# Patient Record
Sex: Male | Born: 1995
Health system: Southern US, Community
[De-identification: ages and names within clinical notes are randomized; demographics above are authoritative.]

## PROBLEM LIST (undated history)

## (undated) DIAGNOSIS — F909 Attention-deficit hyperactivity disorder, unspecified type: Secondary | ICD-10-CM

## (undated) DIAGNOSIS — J45909 Unspecified asthma, uncomplicated: Secondary | ICD-10-CM

## (undated) DIAGNOSIS — B2790 Infectious mononucleosis, unspecified without complication: Secondary | ICD-10-CM

## (undated) HISTORY — PX: ELBOW SURGERY: SHX618

## (undated) HISTORY — PX: LITHOTRIPSY: SUR834

## (undated) HISTORY — PX: HERNIA REPAIR: SHX51

---
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
## 2013-01-01 ENCOUNTER — Encounter: Payer: Self-pay | Admitting: Sports Medicine

## 2013-01-01 ENCOUNTER — Ambulatory Visit (INDEPENDENT_AMBULATORY_CARE_PROVIDER_SITE_OTHER): Payer: BC Managed Care – PPO | Admitting: Sports Medicine

## 2013-01-01 VITALS — BP 125/64 | HR 65 | Wt 158.0 lb

## 2013-01-01 DIAGNOSIS — F988 Other specified behavioral and emotional disorders with onset usually occurring in childhood and adolescence: Secondary | ICD-10-CM

## 2013-01-01 MED ORDER — AMPHETAMINE-DEXTROAMPHET ER 20 MG PO CP24: 20.0000 mg | ORAL_CAPSULE | Freq: Every day | ORAL | Status: DC

## 2013-01-01 NOTE — Progress Notes (Signed)
  Subjective:    CC: Followup  HPI: ADHD: Seth Walls is a very pleasant 17 year old male, his primary care provider started him on Adderall, he's noted a fantastic response, but is noting some decrease in efficacy. He does desire to go up on the dose. He has no palpitations, chest pains, headache, tremors, sweating, decreased appetite, or change in sleep.  Past medical history, Surgical history, Family history not pertinant except as noted below, Social history, Allergies, and medications have been entered into the medical record, reviewed, and no changes needed.   Review of Systems: No fevers, chills, night sweats, weight loss, chest pain, or shortness of breath.   Objective:    General: Well Developed, well nourished, and in no acute distress.  Neuro: Alert and oriented x3, extra-ocular muscles intact, sensation grossly intact.  HEENT: Normocephalic, atraumatic, pupils equal round reactive to light, neck supple, no masses, no lymphadenopathy, thyroid nonpalpable.  Skin: Warm and dry, no rashes. Cardiac: Regular rate and rhythm, no murmurs rubs or gallops, no lower extremity edema.  Respiratory: Clear to auscultation bilaterally. Not using accessory muscles, speaking in full sentences. Impression and Recommendations:

## 2013-01-01 NOTE — Assessment & Plan Note (Signed)
Doing well with Adderall 10 mg daily. He does note some waning of effect, I am going to increase to 20 mg daily. One month supply given, return to see primary care provider in one month for dose adjustment if needed.

## 2013-03-01 ENCOUNTER — Emergency Department
Admission: EM | Admit: 2013-03-01 | Discharge: 2013-03-01 | Disposition: A | Discharge status: Home / Self Care | Payer: BC Managed Care – PPO | Source: Home / Self Care

## 2013-03-01 ENCOUNTER — Encounter: Payer: Self-pay | Admitting: Emergency Medicine

## 2013-03-01 DIAGNOSIS — B9789 Other viral agents as the cause of diseases classified elsewhere: Secondary | ICD-10-CM

## 2013-03-01 DIAGNOSIS — B349 Viral infection, unspecified: Secondary | ICD-10-CM

## 2013-03-01 HISTORY — DX: Attention-deficit hyperactivity disorder, unspecified type: F90.9

## 2013-03-01 HISTORY — DX: Infectious mononucleosis, unspecified without complication: B27.90

## 2013-03-01 LAB — POCT INFLUENZA A/B
Influenza A, POC: NEGATIVE
Influenza B, POC: NEGATIVE

## 2013-03-01 LAB — POCT RAPID STREP A (OFFICE): Rapid Strep A Screen: NEGATIVE

## 2013-03-01 LAB — POCT MONO SCREEN (KUC): Mono, POC: NEGATIVE

## 2013-03-01 MED ORDER — PREDNISONE 50 MG PO TABS: ORAL_TABLET | ORAL | Status: DC

## 2013-03-01 NOTE — ED Notes (Signed)
Reports onset of congestion, sore throat, cough, headache and body aches with fever x 2 days. No flu vaccination this season. Hx of Mono last year. No OTC today.

## 2013-03-01 NOTE — ED Provider Notes (Signed)
CSN: [id number]     Arrival date & time 03/01/13  1411 History   None    Chief Complaint  Patient presents with  . Sore Throat  . Nasal Congestion  . Generalized Body Aches  . Headache  . Cough    HPI  URI Symptoms Onset: 2 days  Description: rhinorrhea, nasal congestion, fever, sore throat, cough, body aches, generalized malaise Modifying factors:  Prior hx/o mono. Sxs not similar to previous mono flare   Symptoms Nasal discharge: yes Fever: yes Sore throat: yes Cough: yes Wheezing: no Ear pain: no GI symptoms: no Sick contacts: yes; friend with similar sxs 1-2 weeks prior to sxs  Red Flags  Stiff neck: no Dyspnea: no Rash: no Swallowing difficulty: no  Sinusitis Risk Factors Headache/face pain: no Double sickening: no tooth pain: no  Allergy Risk Factors Sneezing: no Itchy scratchy throat: no Seasonal symptoms: no  Flu Risk Factors Headache: no muscle aches: yes severe fatigue: mild    Past Medical History  Diagnosis Date  . Asthma   . ADHD (attention deficit hyperactivity disorder)   . Mononucleosis    Past Surgical History  Procedure Laterality Date  . Hernia repair    . Elbow surgery      right   Family History  Problem Relation Age of Onset  . Hyperlipidemia Mother   . Depression Mother   . Hyperlipidemia Mother    History  Substance Use Topics  . Smoking status: Never Smoker   . Smokeless tobacco: Not on file  . Alcohol Use: No    Review of Systems  All other systems reviewed and are negative.    Allergies  Review of patient's allergies indicates no known allergies.  Home Medications   Current Outpatient Rx  Name  Route  Sig  Dispense  Refill  . amphetamine-dextroamphetamine (ADDERALL XR) 20 MG 24 hr capsule   Oral   Take 1 capsule (20 mg total) by mouth daily.   30 capsule   0    BP 115/66  Pulse 102  Temp(Src) 102.5 F (39.2 C) (Oral)  Resp 16  Ht 5' 8.5" (1.74 m)  Wt 151 lb (68.493 kg)  BMI 22.62 kg/m2   SpO2 99% Physical Exam  Constitutional: He appears well-developed and well-nourished.  HENT:  Head: Normocephalic and atraumatic.  Right Ear: External ear normal.  Left Ear: External ear normal.  +nasal erythema, rhinorrhea bilaterally, + post oropharyngeal erythema    Eyes: Conjunctivae are normal. Pupils are equal, round, and reactive to light.  Neck: Normal range of motion.  Pulmonary/Chest: Effort normal and breath sounds normal.  Abdominal: Soft.  Musculoskeletal: Normal range of motion.  Neurological: He is alert.  Skin: Skin is warm.    ED Course  Procedures (including critical care time) Labs Review Labs Reviewed  ENTEROVIRUS PCR  POCT INFLUENZA A/B  POCT RAPID STREP A (OFFICE)  POCT MONO SCREEN ([facility])   Imaging Review No results found.    MDM   1. Viral illness    Rapid strep, mono, flu negative.  Likely viral illness. Enterovirus PCR pending.  PPx rx for prednisone if pt develops wheezing in setting of asthma.  Discussed infectious and resp red flags.  Follow up as needed.     The patient and/or caregiver has been counseled thoroughly with regard to treatment plan and/or medications prescribed including dosage, schedule, interactions, rationale for use, and possible side effects and they verbalize understanding. Diagnoses and expected course of recovery discussed and will return  if not improved as expected or if the condition worsens. Patient and/or caregiver verbalized understanding.         Doree Albee, MD 03/01/13 [phone]

## 2013-03-04 LAB — ENTEROVIRUS PCR: Enterovirus RNA, RT-PCR: NOT DETECTED

## 2013-03-05 ENCOUNTER — Telehealth: Payer: Self-pay | Admitting: Emergency Medicine

## 2013-05-18 ENCOUNTER — Emergency Department
Admission: EM | Admit: 2013-05-18 | Discharge: 2013-05-18 | Disposition: A | Discharge status: Home / Self Care | Payer: BC Managed Care – PPO | Source: Home / Self Care | Attending: Family Medicine | Admitting: Family Medicine

## 2013-05-18 ENCOUNTER — Encounter: Payer: Self-pay | Admitting: Emergency Medicine

## 2013-05-18 DIAGNOSIS — N451 Epididymitis: Secondary | ICD-10-CM

## 2013-05-18 DIAGNOSIS — N509 Disorder of male genital organs, unspecified: Secondary | ICD-10-CM

## 2013-05-18 DIAGNOSIS — N453 Epididymo-orchitis: Secondary | ICD-10-CM

## 2013-05-18 DIAGNOSIS — N50819 Testicular pain, unspecified: Secondary | ICD-10-CM

## 2013-05-18 LAB — POCT URINALYSIS DIP (MANUAL ENTRY)
Bilirubin, UA: NEGATIVE
Glucose, UA: NEGATIVE
Ketones, POC UA: NEGATIVE
Leukocytes, UA: NEGATIVE
Nitrite, UA: NEGATIVE
Protein Ur, POC: NEGATIVE
Spec Grav, UA: >=1.03 (ref 1.005–1.03)
Urobilinogen, UA: 0.2 (ref 0–1)
pH, UA: 6 (ref 5–8)

## 2013-05-18 LAB — POCT CBC W AUTO DIFF (K'VILLE URGENT CARE)

## 2013-05-18 MED ORDER — DOXYCYCLINE HYCLATE 100 MG PO CAPS: 100.0000 mg | ORAL_CAPSULE | Freq: Two times a day (BID) | ORAL | Status: DC

## 2013-05-18 MED ORDER — CEFTRIAXONE SODIUM 250 MG IJ SOLR
250.0000 mg | Freq: Once | INTRAMUSCULAR | Status: AC
  Administered 2013-05-18: 250 mg via INTRAMUSCULAR

## 2013-05-18 NOTE — ED Notes (Signed)
C/o testicular pain x this AM. Pain in right side started about 1 hour ago. No urinary difficulties.

## 2013-05-18 NOTE — ED Provider Notes (Signed)
CSN: [id number]     Arrival date & time 05/18/13  1620 History   First MD Initiated Contact with Patient 05/18/13 1758     Chief Complaint  Patient presents with  . Testicle Pain      HPI Comments: Patient noted bilateral testicular soreness, worse on the right, this morning.  No urinary symptoms.  No urethral discharge.  No abdominal pain.  No fevers, chills, and sweats.  No nausea/vomiting.   He has past history of bilateral inguinal hernia repair as a child.  Patient is a 17 y.o. male presenting with testicular pain. The history is provided by the patient.  Testicle Pain This is a new problem. The current episode started 6 to 12 hours ago. The problem occurs constantly. The problem has not changed since onset.Pertinent negatives include no abdominal pain. The symptoms are aggravated by walking. Nothing relieves the symptoms. He has tried nothing for the symptoms.    Past Medical History  Diagnosis Date  . Asthma   . ADHD (attention deficit hyperactivity disorder)   . Mononucleosis    Past Surgical History  Procedure Laterality Date  . Hernia repair    . Elbow surgery      right   Family History  Problem Relation Age of Onset  . Hyperlipidemia Mother   . Depression Mother   . Hyperlipidemia Mother    History  Substance Use Topics  . Smoking status: Never Smoker   . Smokeless tobacco: Never Used  . Alcohol Use: No    Review of Systems  Gastrointestinal: Negative for abdominal pain.  Genitourinary: Positive for testicular pain.  All other systems reviewed and are negative.    Allergies  Review of patient's allergies indicates no known allergies.  Home Medications   Current Outpatient Rx  Name  Route  Sig  Dispense  Refill  . doxycycline (VIBRAMYCIN) 100 MG capsule   Oral   Take 1 capsule (100 mg total) by mouth 2 (two) times daily.   14 capsule   0    BP 115/75  Pulse 57  Temp(Src) 98.1 F (36.7 C) (Oral)  Resp 14  Wt 151 lb (68.493 kg)  SpO2  100% Physical Exam Nursing notes and Vital Signs reviewed. Appearance:  Patient appears healthy, stated age, and in no acute distress Eyes:  Pupils are equal, round, and reactive to light and accomodation.  Extraocular movement is intact.  Conjunctivae are not inflamed   Pharynx:  Normal Neck:  Supple.  No adenopathy Lungs:  Clear to auscultation.  Breath sounds are equal.  Heart:  Regular rate and rhythm without murmurs, rubs, or gallops.  Abdomen:  There is vague right lower quadrant tenderness without masses or hepatosplenomegaly.  Bowel sounds are present. Negative iliopsoas and obdurator tests.  No CVA or flank tenderness.  Extremities:  No edema.  No calf tenderness Skin:  No rash present.  Genitourinary:  Penis normal without lesions or urethral discharge.  Scrotum is normal without swelling, erythema, or warmth.  Testes are descended bilaterally without nodules, and are oriented normally.  There is mild right posterior testicular tenderness.  Normal bilateral cremasteric reflex is present. No hernias are palpated.  No regional lymphadenopathy palpated   ED Course  Procedures     Labs Reviewed  URINE CULTURE  GC/CHLAMYDIA PROBE AMP, URINE  POCT URINALYSIS DIP (MANUAL ENTRY) SG >= 1.030; BLO trace-intact, otherwise negative  POCT CBC W AUTO DIFF ([facility])  WBC 6.6; LY 29.9; MO 10.6; GR 59.5; Hgb  14.5; Platelets 212         MDM   1. Testicular pain; no evidence testicular torsion on exam   2. Epididymitis, right    GC/chlamydia, urine culture pending. Empiric Rocephin 250mg  IM, doxycycline 100mg  bid for one week. May take Ibuprofen 200mg , 4 tabs every 8 hours with food. Followup with urologist one week if not improving. If symptoms become significantly worse during the night or over the weekend, proceed to the local emergency room.     Lattie Haw, MD 05/21/13 [phone]

## 2013-05-20 LAB — URINE CULTURE
Colony Count: NO GROWTH
Organism ID, Bacteria: NO GROWTH

## 2013-05-20 LAB — GC/CHLAMYDIA PROBE AMP, URINE
Chlamydia, Swab/Urine, PCR: NEGATIVE
GC Probe Amp, Urine: NEGATIVE

## 2013-05-24 ENCOUNTER — Telehealth: Payer: Self-pay | Admitting: *Deleted

## 2013-06-03 ENCOUNTER — Ambulatory Visit: Payer: BC Managed Care – PPO | Admitting: Family Medicine

## 2013-06-05 ENCOUNTER — Ambulatory Visit: Payer: BC Managed Care – PPO | Admitting: Family Medicine

## 2013-06-05 DIAGNOSIS — Z0289 Encounter for other administrative examinations: Secondary | ICD-10-CM

## 2013-06-18 ENCOUNTER — Encounter: Payer: Self-pay | Admitting: Emergency Medicine

## 2013-06-18 ENCOUNTER — Emergency Department (INDEPENDENT_AMBULATORY_CARE_PROVIDER_SITE_OTHER)
Admission: EM | Admit: 2013-06-18 | Discharge: 2013-06-18 | Disposition: A | Discharge status: Home / Self Care | Payer: BC Managed Care – PPO | Source: Home / Self Care | Attending: Family Medicine | Admitting: Family Medicine

## 2013-06-18 DIAGNOSIS — J069 Acute upper respiratory infection, unspecified: Secondary | ICD-10-CM

## 2013-06-18 DIAGNOSIS — J029 Acute pharyngitis, unspecified: Secondary | ICD-10-CM

## 2013-06-18 DIAGNOSIS — R509 Fever, unspecified: Secondary | ICD-10-CM

## 2013-06-18 LAB — POCT INFLUENZA A/B
Influenza A, POC: NEGATIVE
Influenza B, POC: NEGATIVE

## 2013-06-18 LAB — POCT RAPID STREP A (OFFICE): Rapid Strep A Screen: NEGATIVE

## 2013-06-18 MED ORDER — OSELTAMIVIR PHOSPHATE 75 MG PO CAPS: 75.0000 mg | ORAL_CAPSULE | Freq: Two times a day (BID) | ORAL | Status: DC

## 2013-06-18 MED ORDER — PREDNISONE 50 MG PO TABS: ORAL_TABLET | ORAL | Status: DC

## 2013-06-18 NOTE — ED Provider Notes (Signed)
CSN: [id number]     Arrival date & time 06/18/13  1212 History   First MD Initiated Contact with Patient 06/18/13 1214     Chief Complaint  Patient presents with  . Fever  . Sore Throat    HPI  URI Symptoms Onset: 2 days  Description: rhinorrhea, nasal congestion, cough, fever, sore throat  Modifying factors:  Prior hx/o mono in the past   Symptoms Nasal discharge: yes Fever: yes. tmax 100.8 Sore throat: yes Cough: yes Wheezing: no Ear pain: no GI symptoms: no Sick contacts: yes  Red Flags  Stiff neck: no Dyspnea: no Rash: no Swallowing difficulty: no  Sinusitis Risk Factors Headache/face pain: no Double sickening: no tooth pain: no  Allergy Risk Factors Sneezing: no Itchy scratchy throat: no Seasonal symptoms: no  Flu Risk Factors Headache: no muscle aches: no severe fatigue: no   Past Medical History  Diagnosis Date  . Asthma   . ADHD (attention deficit hyperactivity disorder)   . Mononucleosis    Past Surgical History  Procedure Laterality Date  . Hernia repair    . Elbow surgery      right   Family History  Problem Relation Age of Onset  . Hyperlipidemia Mother   . Depression Mother   . Hyperlipidemia Mother    History  Substance Use Topics  . Smoking status: Never Smoker   . Smokeless tobacco: Never Used  . Alcohol Use: No    Review of Systems  All other systems reviewed and are negative.    Allergies  Review of patient's allergies indicates no known allergies.  Home Medications  No current outpatient prescriptions on file. BP 118/78  Pulse 92  Temp(Src) 100.7 F (38.2 C) (Oral)  Resp 16  Wt 150 lb (68.04 kg)  SpO2 99% Physical Exam  Constitutional: He is oriented to person, place, and time. He appears well-developed and well-nourished.  HENT:  Head: Normocephalic and atraumatic.  Right Ear: External ear normal.  Left Ear: External ear normal.  +nasal erythema, rhinorrhea bilaterally, + post oropharyngeal erythema     Eyes: Conjunctivae are normal. Pupils are equal, round, and reactive to light.  Neck: Normal range of motion. Neck supple.  Cardiovascular: Normal rate and regular rhythm.   Pulmonary/Chest: Effort normal and breath sounds normal.  Abdominal: Soft.  Musculoskeletal: Normal range of motion.  Neurological: He is alert and oriented to person, place, and time.  Skin: Skin is warm.    ED Course  Procedures (including critical care time) Labs Review Labs Reviewed  POCT RAPID STREP A (OFFICE)  POCT INFLUENZA A/B   Imaging Review No results found.  EKG Interpretation    Date/Time:    Ventricular Rate:    PR Interval:    QRS Duration:   QT Interval:    QTC Calculation:   R Axis:     Text Interpretation:              MDM   1. Fever, unspecified   2. URI (upper respiratory infection)    Suspect likely viral illness Rapid strep and flu negative.  Strep culture.  WIll place on tamiflu given duration of sxs.  Prednisone if any wheezing ensues in setting of hx/o asthma.  Discussed supportive care and infectious/resp red flags.  Follow up as needed.     The patient and/or caregiver has been counseled thoroughly with regard to treatment plan and/or medications prescribed including dosage, schedule, interactions, rationale for use, and possible side effects and they verbalize  understanding. Diagnoses and expected course of recovery discussed and will return if not improved as expected or if the condition worsens. Patient and/or caregiver verbalized understanding.          Doree AlbeeSteven Malijah Lietz, MD 06/18/13 1239

## 2013-06-18 NOTE — ED Notes (Signed)
Seth Walls c/o sore throat, body aches and nausea x 3 days, fever started today. No flu vac this season.

## 2013-06-19 LAB — STREP A DNA PROBE: GASP: NEGATIVE

## 2013-06-20 ENCOUNTER — Telehealth: Payer: Self-pay

## 2013-06-20 NOTE — ED Notes (Signed)
Left a message on voice mail asking how patient is feeling and advising to call back with any questions or concerns.  

## 2014-01-02 ENCOUNTER — Emergency Department (INDEPENDENT_AMBULATORY_CARE_PROVIDER_SITE_OTHER)
Admission: EM | Admit: 2014-01-02 | Discharge: 2014-01-02 | Disposition: A | Discharge status: Home / Self Care | Payer: BC Managed Care – PPO | Source: Home / Self Care | Attending: Family Medicine | Admitting: Family Medicine

## 2014-01-02 ENCOUNTER — Encounter: Payer: Self-pay | Admitting: Emergency Medicine

## 2014-01-02 ENCOUNTER — Emergency Department (INDEPENDENT_AMBULATORY_CARE_PROVIDER_SITE_OTHER): Payer: BC Managed Care – PPO

## 2014-01-02 DIAGNOSIS — M25469 Effusion, unspecified knee: Secondary | ICD-10-CM

## 2014-01-02 DIAGNOSIS — S99929A Unspecified injury of unspecified foot, initial encounter: Secondary | ICD-10-CM

## 2014-01-02 DIAGNOSIS — S8982XA Other specified injuries of left lower leg, initial encounter: Secondary | ICD-10-CM

## 2014-01-02 DIAGNOSIS — S8990XA Unspecified injury of unspecified lower leg, initial encounter: Secondary | ICD-10-CM

## 2014-01-02 DIAGNOSIS — S99919A Unspecified injury of unspecified ankle, initial encounter: Secondary | ICD-10-CM

## 2014-01-02 MED ORDER — MELOXICAM 15 MG PO TABS: 15.0000 mg | ORAL_TABLET | Freq: Every day | ORAL | Status: DC

## 2014-01-02 NOTE — ED Notes (Signed)
Fell playing soccer yesterday.  Felt something "pop" in L knee.  Stayed out of game for about 30' and went back in.  Fell on knee again.  Pain worse.

## 2014-01-02 NOTE — Discharge Instructions (Signed)
Apply ice pack for 20 to 30 minutes, 3 to 4 times daily  Continue until pain and swelling decrease.  Rest (avoid athletic activities).  Avoid lifting.  Elevate leg.  Wear brace daytime.

## 2014-01-02 NOTE — ED Provider Notes (Signed)
CSN: [id number]     Arrival date & time 01/02/14  1051 History   First MD Initiated Contact with Patient 01/02/14 1204     Chief Complaint  Patient presents with  . Knee Pain      HPI Comments: Patient was playing soccer yesterday when he hyperextended his left knee and felt a painful popping sensation.  He felt better after about 30 minutes and resumed playing.  He then recalls landing on his left foot in an awkward manner and felt a twisting movement of his left knee with recurrent pain.  He has had gradual swelling in his left knee. His left knee remains mildly painful and swollen, and it feels unstable but no sensation of locking.  He has been taking Aleve.  Patient is a 18 y.o. male presenting with knee pain. The history is provided by the patient.  Knee Pain Location:  Knee Time since incident:  1 day Injury: yes   Mechanism of injury comment:  Playing soccer Knee location:  L knee Pain details:    Quality:  Aching   Radiates to:  Does not radiate   Severity:  Mild   Onset quality:  Sudden   Duration:  1 day   Timing:  Constant   Progression:  Unchanged Chronicity:  New Dislocation: no   Prior injury to area:  No Relieved by:  Nothing Worsened by:  Bearing weight and extension Ineffective treatments:  NSAIDs and ice Associated symptoms: decreased ROM, stiffness and swelling   Associated symptoms: no back pain, no muscle weakness, no numbness and no tingling     Past Medical History  Diagnosis Date  . Asthma   . ADHD (attention deficit hyperactivity disorder)   . Mononucleosis    Past Surgical History  Procedure Laterality Date  . Hernia repair    . Elbow surgery      right   Family History  Problem Relation Age of Onset  . Hyperlipidemia Mother   . Depression Mother   . Hyperlipidemia Mother    History  Substance Use Topics  . Smoking status: Never Smoker   . Smokeless tobacco: Never Used  . Alcohol Use: No    Review of Systems  Musculoskeletal:  Positive for stiffness. Negative for back pain.  All other systems reviewed and are negative.   Allergies  Review of patient's allergies indicates no known allergies.  Home Medications   Prior to Admission medications   Medication Sig Start Date End Date Taking? Authorizing Provider  meloxicam (MOBIC) 15 MG tablet Take 1 tablet (15 mg total) by mouth daily. Take with food each morning 01/02/14   Lattie HawStephen A Beese, MD   BP 110/65  Pulse 60  Temp(Src) 97.8 F (36.6 C) (Oral)  Ht 5\' 10"  (1.778 m)  Wt 150 lb (68.04 kg)  BMI 21.52 kg/m2  SpO2 100% Physical Exam  Nursing note and vitals reviewed. Constitutional: He is oriented to person, place, and time. He appears well-developed and well-nourished. No distress.  HENT:  Head: Atraumatic.  Eyes: Conjunctivae are normal. Pupils are equal, round, and reactive to light.  Musculoskeletal:       Left knee: He exhibits decreased range of motion and effusion. He exhibits no swelling, no ecchymosis, no deformity, no laceration, no erythema, normal alignment, no LCL laxity, normal patellar mobility, no bony tenderness, normal meniscus and no MCL laxity. Tenderness found. No medial joint line, no lateral joint line, no MCL, no LCL and no patellar tendon tenderness noted.  Legs: Left knee has mild tenderness in the popliteal fossa over insertion of flexor tendons.  Knee stable:  Negative drawer test.  Negative McMurray test.  There is mild muscular tenderness anteriorly as noted on diagram.  Neurological: He is alert and oriented to person, place, and time.  Skin: Skin is warm and dry.    ED Course  Procedures  none  Imaging Review Dg Knee Complete 4 Views Left  01/02/2014   CLINICAL DATA:  Trauma and pain.  EXAM: LEFT KNEE - COMPLETE 4+ VIEW  COMPARISON:  None.  FINDINGS: No acute fracture or dislocation. Small suprapatellar joint effusion.  IMPRESSION: Small knee joint effusion.   Electronically Signed   By: Jeronimo Greaves M.D.   On:  01/02/2014 12:24     MDM   1. Knee hyperextension injury, left, initial encounter    Hinged knee brace applied.  Begin Mobic. Apply ice pack for 20 to 30 minutes, 3 to 4 times daily  Continue until pain and swelling decrease.  Rest (avoid athletic activities).  Avoid lifting.  Elevate leg.  Wear brace daytime.  Followup with Dr. Rodney [facility] ([facility]) on 01/11/14 .    Lattie Haw, MD 01/02/14 1311

## 2014-01-11 ENCOUNTER — Institutional Professional Consult (permissible substitution): Payer: Self-pay | Admitting: Sports Medicine

## 2014-02-09 ENCOUNTER — Encounter: Payer: Self-pay | Admitting: Emergency Medicine

## 2014-02-09 ENCOUNTER — Emergency Department (INDEPENDENT_AMBULATORY_CARE_PROVIDER_SITE_OTHER): Payer: BC Managed Care – PPO

## 2014-02-09 ENCOUNTER — Ambulatory Visit (INDEPENDENT_AMBULATORY_CARE_PROVIDER_SITE_OTHER): Payer: BC Managed Care – PPO | Admitting: Sports Medicine

## 2014-02-09 ENCOUNTER — Emergency Department (INDEPENDENT_AMBULATORY_CARE_PROVIDER_SITE_OTHER)
Admission: EM | Admit: 2014-02-09 | Discharge: 2014-02-09 | Disposition: A | Discharge status: Home / Self Care | Payer: BC Managed Care – PPO | Source: Home / Self Care

## 2014-02-09 DIAGNOSIS — X58XXXA Exposure to other specified factors, initial encounter: Secondary | ICD-10-CM

## 2014-02-09 DIAGNOSIS — S62309A Unspecified fracture of unspecified metacarpal bone, initial encounter for closed fracture: Secondary | ICD-10-CM | POA: Diagnosis not present

## 2014-02-09 DIAGNOSIS — S62319A Displaced fracture of base of unspecified metacarpal bone, initial encounter for closed fracture: Secondary | ICD-10-CM

## 2014-02-09 DIAGNOSIS — M79641 Pain in right hand: Secondary | ICD-10-CM

## 2014-02-09 DIAGNOSIS — S62306A Unspecified fracture of fifth metacarpal bone, right hand, initial encounter for closed fracture: Secondary | ICD-10-CM | POA: Insufficient documentation

## 2014-02-09 DIAGNOSIS — M79609 Pain in unspecified limb: Secondary | ICD-10-CM

## 2014-02-09 MED ORDER — HYDROCODONE-ACETAMINOPHEN 5-325 MG PO TABS: 1.0000 | ORAL_TABLET | Freq: Three times a day (TID) | ORAL | Status: DC | PRN

## 2014-02-09 NOTE — Assessment & Plan Note (Signed)
Ulnar gutter splint placed. Oral analgesics as needed. Return in 2 weeks, we will likely not need an x-ray.  I billed a fracture code for this encounter, all subsequent visits will be post-op checks in the global period.
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
## 2014-02-09 NOTE — ED Provider Notes (Signed)
Agree with exam, assessment, and plan.   Stephen A Beese, MD 02/09/14 1822 

## 2014-02-09 NOTE — Discharge Instructions (Signed)
Stay in splint for 2 weeks.  Tylenol for pain.

## 2014-02-23 ENCOUNTER — Encounter: Payer: Self-pay | Admitting: Sports Medicine

## 2014-02-23 ENCOUNTER — Ambulatory Visit (INDEPENDENT_AMBULATORY_CARE_PROVIDER_SITE_OTHER): Payer: BC Managed Care – PPO | Admitting: Sports Medicine

## 2014-02-23 VITALS — BP 112/68 | HR 61 | Ht 68.0 in | Wt 153.0 lb

## 2014-02-23 DIAGNOSIS — S62306D Unspecified fracture of fifth metacarpal bone, right hand, subsequent encounter for fracture with routine healing: Secondary | ICD-10-CM

## 2014-02-23 NOTE — Assessment & Plan Note (Signed)
Doing well 2 weeks post fracture, transition into a Velcro wrist brace, return in 2 weeks. No x-ray needed.

## 2014-02-23 NOTE — Progress Notes (Signed)
  Subjective: 2 weeks post fracture at the base of the fifth metacarpal, pain-free.   Objective: General: Well-developed, well-nourished, and in no acute distress. Right hand: Ulnar gutter splint removed, good motion, no swelling, only minimal tenderness at the fracture site.  Assessment/plan:

## 2014-12-02 IMAGING — CR DG HAND COMPLETE 3+V*R*
3 series · 3 of 3 positions shown · non-contrast
Comparison: None.

CLINICAL DATA: Right hand pain secondary to blunt trauma today.

EXAM:
RIGHT HAND - COMPLETE 3+ VIEW

[view not recorded (1 of 3)]
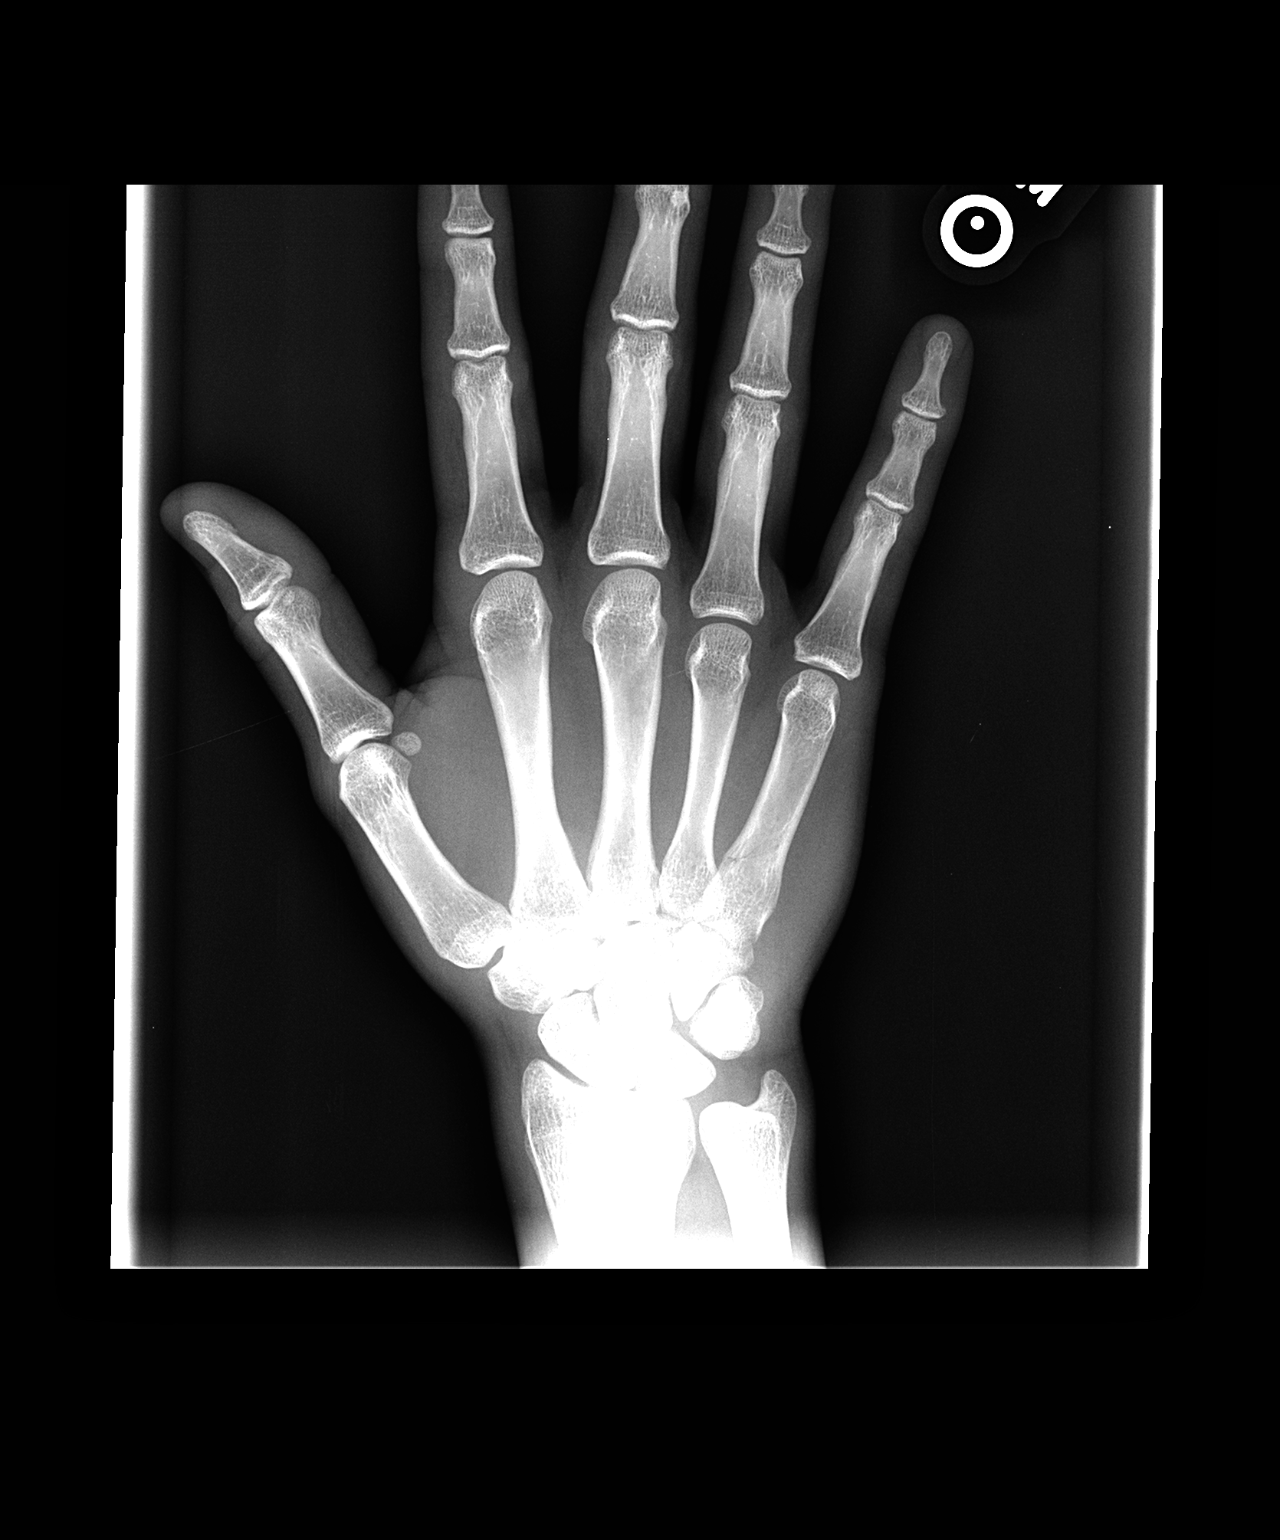

[view not recorded (2 of 3)]
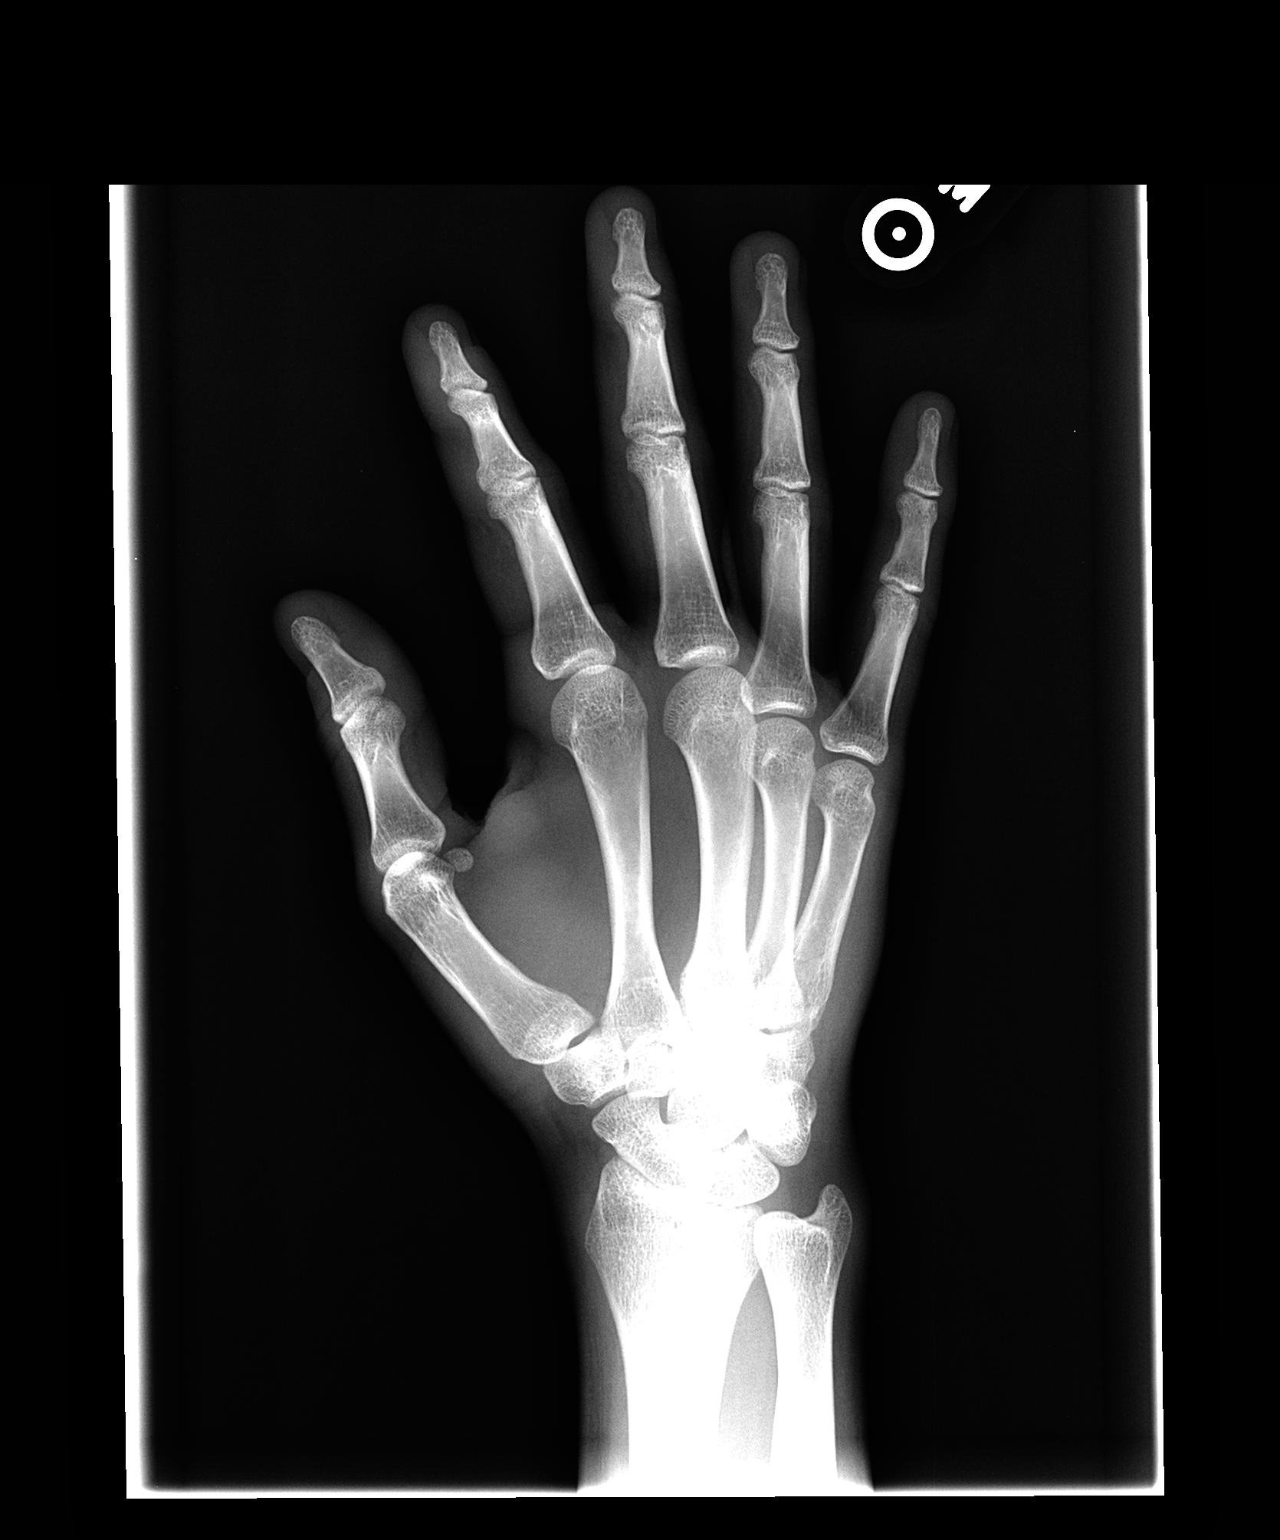

[view not recorded (3 of 3)]
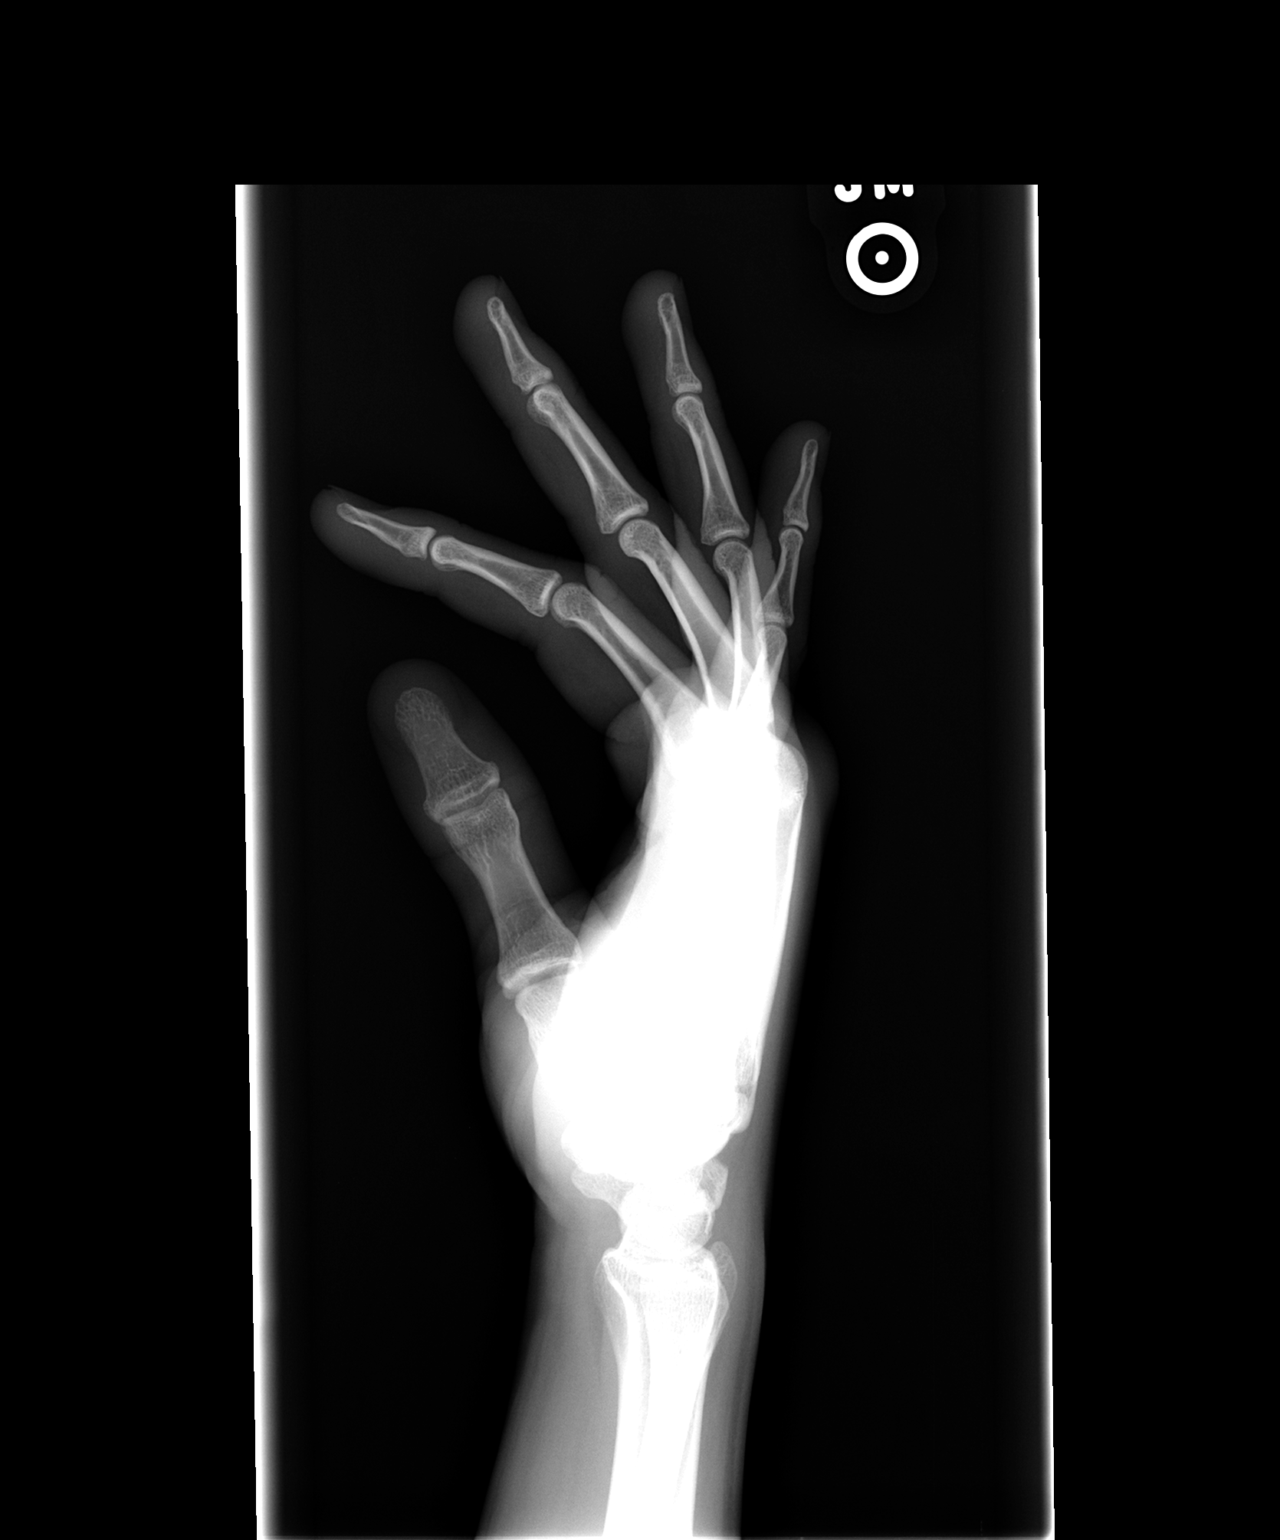

[3 of 3 positions shown; findings below may reference images not displayed]

FINDINGS: There is a nondisplaced spiral fracture through the base of the
fifth metacarpal. No other osseous abnormality. No dislocation.
IMPRESSION: Nondisplaced fracture of the base of the fifth metacarpal.

## 2015-01-04 ENCOUNTER — Emergency Department (INDEPENDENT_AMBULATORY_CARE_PROVIDER_SITE_OTHER)
Admission: EM | Admit: 2015-01-04 | Discharge: 2015-01-04 | Disposition: A | Discharge status: Home / Self Care | Payer: BLUE CROSS/BLUE SHIELD | Source: Home / Self Care | Attending: Family Medicine | Admitting: Family Medicine

## 2015-01-04 DIAGNOSIS — L03114 Cellulitis of left upper limb: Secondary | ICD-10-CM | POA: Diagnosis not present

## 2015-01-04 MED ORDER — DOXYCYCLINE HYCLATE 100 MG PO CAPS: 100.0000 mg | ORAL_CAPSULE | Freq: Two times a day (BID) | ORAL | Status: DC

## 2015-01-04 NOTE — Discharge Instructions (Signed)
Keep wound bandaged and change daily.  Keep clean and dry.   Cellulitis Cellulitis is an infection of the skin and the tissue beneath it. The infected area is usually red and tender. Cellulitis occurs most often in the arms and lower legs.  CAUSES  Cellulitis is caused by bacteria that enter the skin through cracks or cuts in the skin. The most common types of bacteria that cause cellulitis are staphylococci and streptococci. SIGNS AND SYMPTOMS   Redness and warmth.  Swelling.  Tenderness or pain.  Fever. DIAGNOSIS  Your health care provider can usually determine what is wrong based on a physical exam. Blood tests may also be done. TREATMENT  Treatment usually involves taking an antibiotic medicine. HOME CARE INSTRUCTIONS   Take your antibiotic medicine as directed by your health care provider. Finish the antibiotic even if you start to feel better.  Keep the infected arm or leg elevated to reduce swelling.  Apply a warm cloth to the affected area up to 4 times per day to relieve pain.  Take medicines only as directed by your health care provider.  Keep all follow-up visits as directed by your health care provider. SEEK MEDICAL CARE IF:   You notice red streaks coming from the infected area.  Your red area gets larger or turns dark in color.  Your bone or joint underneath the infected area becomes painful after the skin has healed.  Your infection returns in the same area or another area.  You notice a swollen bump in the infected area.  You develop new symptoms.  You have a fever. SEEK IMMEDIATE MEDICAL CARE IF:   You feel very sleepy.  You develop vomiting or diarrhea.  You have a general ill feeling (malaise) with muscle aches and pains. MAKE SURE YOU:   Understand these instructions.  Will watch your condition.  Will get help right away if you are not doing well or get worse. Document Released: 02/14/2005 Document Revised: 09/21/2013 Document Reviewed:  07/23/2011 [facility] Patient Information 2015 [place], [place]. This information is not intended to replace advice given to you by your health care provider. Make sure you discuss any questions you have with your health care provider.
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
## 2015-01-06 ENCOUNTER — Telehealth: Payer: Self-pay | Admitting: Emergency Medicine

## 2015-01-07 LAB — WOUND CULTURE
Gram Stain: NONE SEEN
Gram Stain: NONE SEEN
Gram Stain: NONE SEEN
Organism ID, Bacteria: NO GROWTH

## 2015-01-11 ENCOUNTER — Ambulatory Visit (INDEPENDENT_AMBULATORY_CARE_PROVIDER_SITE_OTHER): Payer: BLUE CROSS/BLUE SHIELD | Admitting: Family Medicine

## 2015-01-11 ENCOUNTER — Encounter: Payer: Self-pay | Admitting: Family Medicine

## 2015-01-11 VITALS — BP 125/76 | HR 66 | Ht 68.0 in | Wt 150.0 lb

## 2015-01-11 DIAGNOSIS — F900 Attention-deficit hyperactivity disorder, predominantly inattentive type: Secondary | ICD-10-CM | POA: Diagnosis not present

## 2015-01-11 DIAGNOSIS — F909 Attention-deficit hyperactivity disorder, unspecified type: Secondary | ICD-10-CM

## 2015-01-11 MED ORDER — AMPHETAMINE-DEXTROAMPHETAMINE 10 MG PO TABS
10.0000 mg | ORAL_TABLET | Freq: Every day | ORAL | Status: DC
Start: 2015-01-11 — End: 2015-12-20

## 2015-01-11 MED ORDER — AMPHETAMINE-DEXTROAMPHET ER 30 MG PO CP24: 30.0000 mg | ORAL_CAPSULE | ORAL | Status: DC

## 2015-01-11 NOTE — Progress Notes (Signed)
CC: Seth Walls is a 19 y.o. male is here for Medication Management   Subjective: HPI:  Follow-up ADHD: When he was taking classes at [employer] student health department had him on 30 mg of Adderall XR in the morning and then a as needed 10 mg of the immediate release to take in the afternoon. He tells me that this was helping a lot with his grades. When he returned home to start attending classes at [employer] he stopped taking it to see if he still needed it. His GPA has been suffering because of distractibility and inattention. He wants to restart these medications. He denies any known side effects. He denies sleep disturbance, appetite suppression, paranoia or anxiety. Symptoms are moderate in severity on a daily basis.   Review Of Systems Outlined In HPI  Past Medical History  Diagnosis Date  . Asthma   . ADHD (attention deficit hyperactivity disorder)   . Mononucleosis     Past Surgical History  Procedure Laterality Date  . Hernia repair    . Elbow surgery      right   Family History  Problem Relation Age of Onset  . Depression Mother   . Hyperlipidemia Mother   . Diabetes Mother     Social History   Social History  . Marital Status: Single    Spouse Name: N/A  . Number of Children: N/A  . Years of Education: N/A   Occupational History  . Not on file.   Social History Main Topics  . Smoking status: Current Some Day Smoker -- 0.50 packs/day  . Smokeless tobacco: Never Used  . Alcohol Use: No  . Drug Use: No  . Sexual Activity: No   Other Topics Concern  . Not on file   Social History Narrative     Objective: BP 125/76 mmHg  Pulse 66  Ht  (1.727 m)  Wt 150 lb (68.04 kg)  BMI 22.81 kg/m2  SpO2 100%  Vital signs reviewed. General: Alert and Oriented, No Acute Distress HEENT: Pupils equal, round, reactive to light. Conjunctivae clear.  External ears unremarkable.  Moist mucous membranes. Lungs: Clear and comfortable work of breathing, speaking in  full sentences without accessory muscle use. Cardiac: Regular rate and rhythm.  Neuro: CN II-XII grossly intact, gait normal. Extremities: No peripheral edema.  Strong peripheral pulses.  Mental Status: No depression, anxiety, nor agitation. Logical though process. Skin: Warm and dry.  Assessment & Plan: Zahmir was seen today for medication management.  Diagnoses and all orders for this visit:  Adult ADHD  Other orders -     amphetamine-dextroamphetamine (ADDERALL XR) 30 MG 24 hr capsule; Take 1 capsule (30 mg total) by mouth every morning. -     amphetamine-dextroamphetamine (ADDERALL) 10 MG tablet; Take 1 tablet (10 mg total) by mouth daily with breakfast.   Adult ADHD: Uncontrolled, restarting former regimen that was confirmed on the [place] controlled substance database. Follow-up in 3 months, sooner if the medication becomes ineffective. May call every month for refill  No Follow-up on file.

## 2015-07-11 ENCOUNTER — Encounter: Payer: Self-pay | Admitting: *Deleted

## 2015-07-11 ENCOUNTER — Emergency Department (INDEPENDENT_AMBULATORY_CARE_PROVIDER_SITE_OTHER)
Admission: EM | Admit: 2015-07-11 | Discharge: 2015-07-11 | Disposition: A | Discharge status: Home / Self Care | Payer: BLUE CROSS/BLUE SHIELD | Source: Home / Self Care | Attending: Family Medicine | Admitting: Family Medicine

## 2015-07-11 DIAGNOSIS — R05 Cough: Secondary | ICD-10-CM

## 2015-07-11 DIAGNOSIS — J029 Acute pharyngitis, unspecified: Secondary | ICD-10-CM | POA: Diagnosis not present

## 2015-07-11 DIAGNOSIS — R059 Cough, unspecified: Secondary | ICD-10-CM

## 2015-07-11 LAB — POCT RAPID STREP A (OFFICE): Rapid Strep A Screen: NEGATIVE

## 2015-07-11 MED ORDER — BENZONATATE 100 MG PO CAPS: 100.0000 mg | ORAL_CAPSULE | Freq: Three times a day (TID) | ORAL | Status: DC

## 2015-07-11 MED ORDER — PREDNISONE 20 MG PO TABS: ORAL_TABLET | ORAL | Status: DC

## 2015-07-11 MED ORDER — ALBUTEROL SULFATE HFA 108 (90 BASE) MCG/ACT IN AERS: 1.0000 | INHALATION_SPRAY | Freq: Four times a day (QID) | RESPIRATORY_TRACT | Status: DC | PRN

## 2015-07-11 NOTE — ED Notes (Signed)
Pt c/o sore throat, nonproductive cough, and chest feels tight x 3 days. Denies fever.

## 2015-07-11 NOTE — Discharge Instructions (Signed)
You may take 400-600mg Ibuprofen (Motrin) every 6-8 hours for fever and pain  °Alternate with Tylenol  °You may take 500mg Tylenol every 4-6 hours as needed for fever and pain  °Follow-up with your primary care provider next week for recheck of symptoms if not improving.  °Be sure to drink plenty of fluids and rest, at least 8hrs of sleep a night, preferably more while you are sick. °Return urgent care or go to closest ER if you cannot keep down fluids/signs of dehydration, fever not reducing with Tylenol, difficulty breathing/wheezing, stiff neck, worsening condition, or other concerns (see below)  ° ° °Cool Mist Vaporizers °Vaporizers may help relieve the symptoms of a cough and cold. They add moisture to the air, which helps mucus to become thinner and less sticky. This makes it easier to breathe and cough up secretions. Cool mist vaporizers do not cause serious burns like hot mist vaporizers, which may also be called steamers or humidifiers. Vaporizers have not been proven to help with colds. You should not use a vaporizer if you are allergic to mold. °HOME CARE INSTRUCTIONS °· Follow the package instructions for the vaporizer. °· Do not use anything other than distilled water in the vaporizer. °· Do not run the vaporizer all of the time. This can cause mold or bacteria to grow in the vaporizer. °· Clean the vaporizer after each time it is used. °· Clean and dry the vaporizer well before storing it. °· Stop using the vaporizer if worsening respiratory symptoms develop. °  °This information is not intended to replace advice given to you by your health care provider. Make sure you discuss any questions you have with your health care provider. °  °Document Released: 02/02/2004 Document Revised: 05/12/2013 Document Reviewed: 09/24/2012 °Elsevier Interactive Patient Education ©2016 [employer] ° °

## 2015-07-11 NOTE — ED Provider Notes (Signed)
CSN: [id number]     Arrival date & time 07/11/15  1032 History   First MD Initiated Contact with Patient 07/11/15 1050     Chief Complaint  Patient presents with  . Sore Throat  . Cough   (Consider location/radiation/quality/duration/timing/severity/associated sxs/prior Treatment) HPI  The pt is a 20yo male presenting to [facility] with c/o mild sore throat with mild intermittent non-productive cough for 3 days and mild chest tightness.  He has been using OTC cough/cold medication Dayquil and Nyquil, which provided moderate relief. He denies fever, chills, n/v/d. No sick contacts or recent travel.  Hx of asthma. Does need a refill on his albuterol inhaler.  Past Medical History  Diagnosis Date  . Asthma   . ADHD (attention deficit hyperactivity disorder)   . Mononucleosis    Past Surgical History  Procedure Laterality Date  . Hernia repair    . Elbow surgery      right   Family History  Problem Relation Age of Onset  . Depression Mother   . Hyperlipidemia Mother   . Diabetes Mother    Social History  Substance Use Topics  . Smoking status: Current Some Day Smoker -- 0.50 packs/day  . Smokeless tobacco: Never Used  . Alcohol Use: No    Review of Systems  Constitutional: Negative for fever and chills.  HENT: Positive for congestion, rhinorrhea and sore throat. Negative for ear pain, sinus pressure, trouble swallowing and voice change.   Respiratory: Positive for cough, chest tightness and shortness of breath.   Cardiovascular: Negative for chest pain and palpitations.  Gastrointestinal: Negative for nausea, vomiting, abdominal pain and diarrhea.  Musculoskeletal: Positive for myalgias and arthralgias. Negative for back pain.  Skin: Negative for rash.    Allergies  Review of patient's allergies indicates no known allergies.  Home Medications   Prior to Admission medications   Medication Sig Start Date End Date Taking? Authorizing Provider  albuterol (PROVENTIL HFA;VENTOLIN  HFA) 108 (90 Base) MCG/ACT inhaler Inhale 1-2 puffs into the lungs every 6 (six) hours as needed for wheezing or shortness of breath. 07/11/15   Junius Finner, PA-C  amphetamine-dextroamphetamine (ADDERALL XR) 30 MG 24 hr capsule Take 1 capsule (30 mg total) by mouth every morning. 01/11/15   Laren Boom, DO  amphetamine-dextroamphetamine (ADDERALL) 10 MG tablet Take 1 tablet (10 mg total) by mouth daily with breakfast. 01/11/15   Laren Boom, DO  benzonatate (TESSALON) 100 MG capsule Take 1 capsule (100 mg total) by mouth every 8 (eight) hours. 07/11/15   Junius Finner, PA-C  predniSONE (DELTASONE) 20 MG tablet 2 tabs po daily x 3 days 07/11/15   Junius Finner, PA-C   Meds Ordered and Administered this Visit  Medications - No data to display  BP 127/78 mmHg  Pulse 76  Temp(Src) 98.3 F (36.8 C) (Oral)  Resp 16  Ht  (1.753 m)  Wt 154 lb (69.854 kg)  BMI 22.73 kg/m2  SpO2 99% No data found.   Physical Exam  Constitutional: He appears well-developed and well-nourished.  HENT:  Head: Normocephalic and atraumatic.  Right Ear: Tympanic membrane normal.  Left Ear: Tympanic membrane normal.  Nose: Rhinorrhea present. Right sinus exhibits no maxillary sinus tenderness and no frontal sinus tenderness. Left sinus exhibits no maxillary sinus tenderness and no frontal sinus tenderness.  Mouth/Throat: Uvula is midline and mucous membranes are normal. Posterior oropharyngeal erythema present. No oropharyngeal exudate, posterior oropharyngeal edema or tonsillar abscesses.  Eyes: Conjunctivae are normal. No scleral icterus.  Neck:  Normal range of motion. Neck supple.  Cardiovascular: Normal rate, regular rhythm and normal heart sounds.   Pulmonary/Chest: Effort normal and breath sounds normal. No respiratory distress. He has no wheezes. He has no rales.  Abdominal: Soft. He exhibits no distension. There is no tenderness.  Musculoskeletal: Normal range of motion.  Neurological: He is alert.  Skin:  Skin is warm and dry.  Nursing note and vitals reviewed.   ED Course  Procedures (including critical care time)  Labs Review Labs Reviewed  STREP A DNA PROBE  POCT RAPID STREP A (OFFICE)    Imaging Review No results found.     MDM   1. Acute pharyngitis, unspecified etiology   2. Cough    Pt c/o URI symptoms for 3 days. Hx of asthma. C/o cough and chest tightness. Lungs: CTAB, no respiratory distress. O2 Sat 99% on RA  No evidence of bacterial infection at this time.  Rx: Albuterol, Tessalon, and prednisone  x 3 days for chest tightness/inflammation  Advised pt to use acetaminophen and ibuprofen as needed for fever and pain. Encouraged rest and fluids. F/u with PCP in 7-10 days if not improving, sooner if worsening. Pt verbalized understanding and agreement with tx plan.     Junius Finner, PA-C 07/11/15 1101

## 2015-07-12 ENCOUNTER — Telehealth: Payer: Self-pay | Admitting: Emergency Medicine

## 2015-07-12 LAB — STREP A DNA PROBE: GASP: NOT DETECTED

## 2015-12-20 ENCOUNTER — Encounter: Payer: Self-pay | Admitting: Family Medicine

## 2015-12-20 ENCOUNTER — Ambulatory Visit (INDEPENDENT_AMBULATORY_CARE_PROVIDER_SITE_OTHER): Payer: BLUE CROSS/BLUE SHIELD | Admitting: Family Medicine

## 2015-12-20 VITALS — BP 148/80 | HR 71 | Wt 160.0 lb

## 2015-12-20 DIAGNOSIS — F909 Attention-deficit hyperactivity disorder, unspecified type: Secondary | ICD-10-CM

## 2015-12-20 DIAGNOSIS — F900 Attention-deficit hyperactivity disorder, predominantly inattentive type: Secondary | ICD-10-CM

## 2015-12-20 MED ORDER — AMPHETAMINE-DEXTROAMPHETAMINE 10 MG PO TABS: 10.0000 mg | ORAL_TABLET | Freq: Every day | ORAL | 0 refills | Status: DC

## 2015-12-20 MED ORDER — AMPHETAMINE-DEXTROAMPHET ER 20 MG PO CP24: 20.0000 mg | ORAL_CAPSULE | ORAL | 0 refills | Status: DC

## 2015-12-20 NOTE — Progress Notes (Signed)
CC: Seth Walls is a 20 y.o. male is here for ADHD   Subjective: HPI:  Around the spring he found out that his girlfriend is pregnant. His parents and the girlfriend's parents are both supportive of this. They're going to be having a baby girl. He's going back to college so he can support his new family. He plans on proposing his girlfriend next week in [place].  He was again restarted on Adderall. He tells me that his distractibility doesn't seem to be that big of a deal when not employed or when not in school. He has difficulty pain attention in class and concentrate on homework without this medication. He wonders if the extended release formulation is little too potent, occasionally gets some palpitations in the morning on the days that he takes this. He denies any depression, anxiety or any other mental disturbance.   Review Of Systems Outlined In HPI  Past Medical History:  Diagnosis Date  . ADHD (attention deficit hyperactivity disorder)   . Asthma   . Mononucleosis     Past Surgical History:  Procedure Laterality Date  . ELBOW SURGERY     right  . HERNIA REPAIR     Family History  Problem Relation Age of Onset  . Depression Mother   . Hyperlipidemia Mother   . Diabetes Mother     Social History   Social History  . Marital status: Single    Spouse name: N/A  . Number of children: N/A  . Years of education: N/A   Occupational History  . Not on file.   Social History Main Topics  . Smoking status: Current Some Day Smoker    Packs/day: 0.50  . Smokeless tobacco: Never Used  . Alcohol use No  . Drug use: No  . Sexual activity: No   Other Topics Concern  . Not on file   Social History Narrative  . No narrative on file     Objective: BP (!) 148/80   Pulse 71   Wt 160 lb (72.6 kg)   BMI 23.63 kg/m   Vital signs reviewed. General: Alert and Oriented, No Acute Distress HEENT: Pupils equal, round, reactive to light. Conjunctivae clear.  External  ears unremarkable.  Moist mucous membranes. Lungs: Clear and comfortable work of breathing, speaking in full sentences without accessory muscle use. Cardiac: Regular rate and rhythm.  Neuro: CN II-XII grossly intact, gait normal. Extremities: No peripheral edema.  Strong peripheral pulses.  Mental Status: No depression, anxiety, nor agitation. Logical though process. Skin: Warm and dry.  Assessment & Plan: Seth Walls was seen today for adhd.  Diagnoses and all orders for this visit:  Adult ADHD  Other orders -     amphetamine-dextroamphetamine (ADDERALL XR) 20 MG 24 hr capsule; Take 1 capsule (20 mg total) by mouth every morning. -     amphetamine-dextroamphetamine (ADDERALL) 10 MG tablet; Take 1 tablet (10 mg total) by mouth daily.  Adult ADHD: Seems reasonable for him to restart his extended release Adderall in the morning and immediate release in the afternoon. Other than palpitations never had issues with this medication, joint decision to decrease XR formulation to the palpitations.  Discussed with this patient that I will be resigning from my position here with [employer] in September in order to stay with my family who will be moving to [facility]. I let him know about the providers that are still accepting patients and I feel that this individual will be under  great care if he/she stays here with [facility].   Return in about 3 months (around 03/21/2016) for Call for refills in one month.Marland Kitchen

## 2015-12-30 ENCOUNTER — Ambulatory Visit (HOSPITAL_BASED_OUTPATIENT_CLINIC_OR_DEPARTMENT_OTHER)
Admit: 2015-12-30 | Discharge: 2015-12-30 | Disposition: A | Discharge status: Home / Self Care | Payer: BLUE CROSS/BLUE SHIELD | Attending: Family Medicine | Admitting: Family Medicine

## 2015-12-30 ENCOUNTER — Emergency Department (INDEPENDENT_AMBULATORY_CARE_PROVIDER_SITE_OTHER)
Admission: EM | Admit: 2015-12-30 | Discharge: 2015-12-30 | Disposition: A | Discharge status: Home / Self Care | Payer: BLUE CROSS/BLUE SHIELD | Source: Home / Self Care | Attending: Family Medicine | Admitting: Family Medicine

## 2015-12-30 ENCOUNTER — Ambulatory Visit (HOSPITAL_BASED_OUTPATIENT_CLINIC_OR_DEPARTMENT_OTHER)
Admission: RE | Admit: 2015-12-30 | Discharge: 2015-12-30 | Disposition: A | Discharge status: Home / Self Care | Payer: BLUE CROSS/BLUE SHIELD | Source: Ambulatory Visit | Attending: Family Medicine | Admitting: Family Medicine

## 2015-12-30 DIAGNOSIS — R112 Nausea with vomiting, unspecified: Secondary | ICD-10-CM

## 2015-12-30 DIAGNOSIS — N50812 Left testicular pain: Secondary | ICD-10-CM | POA: Diagnosis not present

## 2015-12-30 DIAGNOSIS — N433 Hydrocele, unspecified: Secondary | ICD-10-CM | POA: Insufficient documentation

## 2015-12-30 DIAGNOSIS — N50819 Testicular pain, unspecified: Secondary | ICD-10-CM | POA: Diagnosis not present

## 2015-12-30 DIAGNOSIS — N5082 Scrotal pain: Secondary | ICD-10-CM | POA: Diagnosis not present

## 2015-12-30 DIAGNOSIS — N5089 Other specified disorders of the male genital organs: Secondary | ICD-10-CM | POA: Diagnosis not present

## 2015-12-30 LAB — POCT URINALYSIS DIP (MANUAL ENTRY)
Bilirubin, UA: NEGATIVE
Glucose, UA: NEGATIVE
Ketones, POC UA: NEGATIVE
Leukocytes, UA: NEGATIVE
Nitrite, UA: NEGATIVE
Spec Grav, UA: 1.025 (ref 1.005–1.03)
Urobilinogen, UA: 1 (ref 0–1)
pH, UA: 6 (ref 5–8)

## 2015-12-30 NOTE — ED Triage Notes (Signed)
Pt stated that he started throwing up yesterday at work, threw up several more times after getting home.  Last night around 8 pm his testicles started hurting, left more than the right.  Took tylenol this morning at 9am.

## 2015-12-30 NOTE — ED Notes (Signed)
Med center [facility] called and spoke with E. Gershon Mussel'Malley.  Denny Peonrin spoke with patient.

## 2015-12-30 NOTE — ED Triage Notes (Signed)
Pt's ht is 5'9", Wt 155.4 lb.

## 2015-12-30 NOTE — ED Notes (Signed)
Sent to [facility] for US of testicle.

## 2015-12-30 NOTE — ED Provider Notes (Signed)
CSN: [id number]     Arrival date & time 12/30/15  1135 History   First MD Initiated Contact with Patient 12/30/15 1156     Chief Complaint  Patient presents with  . Nausea  . Testicle Pain    Left worse than right   (Consider location/radiation/quality/duration/timing/severity/associated sxs/prior Treatment) HPI  Seth Walls is a 20 y.o. male presenting to UC with c/o testicular pain that started last night.  Pt notes he was at work at [employer] yesterday when he vomited and had to leave work.  He vomited again after he got home, a few minutes later he developed sharp and aching testicular pain, 6/10, worse on the Left side.  Pain started around 8PM.  He took extra strength tylenol around 9AM this morning which provided moderate relief of pain. Denies heavy lifting or known injuries.  Denies concern for STDs.  Denies penile discharge or dysuria. No hx of kidney stones. Denies abdominal pain or back pain. Denies nausea or vomiting today.  He does report hx of bilateral hernia when he was born but states the hernia was surgically repaired and has never had issues.  Past Medical History:  Diagnosis Date  . ADHD (attention deficit hyperactivity disorder)   . Asthma   . Mononucleosis    Past Surgical History:  Procedure Laterality Date  . ELBOW SURGERY     right  . HERNIA REPAIR     Family History  Problem Relation Age of Onset  . Depression Mother   . Hyperlipidemia Mother   . Diabetes Mother    Social History  Substance Use Topics  . Smoking status: Current Some Day Smoker    Packs/day: 0.50    Types: E-cigarettes  . Smokeless tobacco: Never Used  . Alcohol use No    Review of Systems  Constitutional: Negative for chills and fever.  Gastrointestinal: Positive for nausea and vomiting. Negative for abdominal pain and diarrhea.  Genitourinary: Positive for testicular pain. Negative for decreased urine volume, discharge, dysuria, flank pain, frequency, genital sores,  hematuria, penile pain, penile swelling, scrotal swelling and urgency.  Musculoskeletal: Negative for back pain and myalgias.  Skin: Negative for color change and rash.    Allergies  Review of patient's allergies indicates no known allergies.  Home Medications   Prior to Admission medications   Medication Sig Start Date End Date Taking? Authorizing Provider  amphetamine-dextroamphetamine (ADDERALL XR) 20 MG 24 hr capsule Take 1 capsule (20 mg total) by mouth every morning. 12/20/15   Sean Hommel, DO  amphetamine-dextroamphetamine (ADDERALL) 10 MG tablet Take 1 tablet (10 mg total) by mouth daily. 12/20/15   Laren BoomSean Hommel, DO   Meds Ordered and Administered this Visit  Medications - No data to display  BP 125/85 (BP Location: Left Arm)   Pulse 84   Temp 97.7 F (36.5 C) (Oral)   SpO2 100%  No data found.   Physical Exam  Constitutional: He is oriented to person, place, and time. He appears well-developed and well-nourished.  HENT:  Head: Normocephalic and atraumatic.  Eyes: EOM are normal.  Neck: Normal range of motion.  Cardiovascular: Normal rate.   Pulmonary/Chest: Effort normal. No respiratory distress.  Abdominal: Soft. He exhibits no distension. There is no tenderness. Hernia confirmed negative in the right inguinal area and confirmed negative in the left inguinal area.  Genitourinary: Penis normal. Right testis shows tenderness. Right testis shows no mass and no swelling. Right testis is descended. Cremasteric reflex is not absent on the right  side. Left testis shows tenderness. Left testis shows no mass and no swelling. Left testis is descended. Cremasteric reflex is not absent on the left side. Circumcised.  Genitourinary Comments: Chaperoned exam. Normal penis. No scrotal swelling. Tenderness to scrotum. No hernia palpated.   Musculoskeletal: Normal range of motion.  Neurological: He is alert and oriented to person, place, and time.  Skin: Skin is warm and dry.   Psychiatric: He has a normal mood and affect. His behavior is normal.  Nursing note and vitals reviewed.   Urgent Care Course   Clinical Course    Procedures (including critical care time)  Labs Review Labs Reviewed  POCT URINALYSIS DIP (MANUAL ENTRY) - Abnormal; Notable for the following:       Result Value   Blood, UA trace-intact (*)    Protein Ur, POC trace (*)    All other components within normal limits    Imaging Review US Scrotum  Result Date: 12/30/2015 CLINICAL DATA:  20 year old male with bilateral scrotal pain, left greater than right, started 1 day prior. EXAM: SCROTAL ULTRASOUND DOPPLER ULTRASOUND OF THE TESTICLES TECHNIQUE: Complete ultrasound examination of the testicles, epididymis, and other scrotal structures was performed. Color and spectral Doppler ultrasound were also utilized to evaluate blood flow to the testicles. COMPARISON:  None. FINDINGS: Right testicle Measurements: 4.9 x 2.2 x 3.0 cm. No mass or microlithiasis visualized. Left testicle Measurements: 4.6 x 2.1 x 3.1 cm. No mass or microlithiasis visualized. Right epididymis:  Normal in size and appearance. Left epididymis:  Normal in size and appearance. Hydrocele: Borderline small simple hydroceles bilaterally, left greater than right. Varicocele:  None visualized. Pulsed Doppler interrogation of both testes demonstrates normal low resistance arterial and venous waveforms bilaterally. IMPRESSION: 1. Normal testes with no evidence of testicular torsion. 2. No evidence of acute epididymitis. 3. Borderline small simple hydroceles bilaterally, left greater than right. Electronically Signed   By: Delbert Phenix M.D.   On: 12/30/2015 13:51   [place] Art/ven Flow Abd Pelv Doppler  Result Date: 12/30/2015 CLINICAL DATA:  20 year old male with bilateral scrotal pain, left greater than right, started 1 day prior. EXAM: SCROTAL ULTRASOUND DOPPLER ULTRASOUND OF THE TESTICLES TECHNIQUE: Complete ultrasound examination of the  testicles, epididymis, and other scrotal structures was performed. Color and spectral Doppler ultrasound were also utilized to evaluate blood flow to the testicles. COMPARISON:  None. FINDINGS: Right testicle Measurements: 4.9 x 2.2 x 3.0 cm. No mass or microlithiasis visualized. Left testicle Measurements: 4.6 x 2.1 x 3.1 cm. No mass or microlithiasis visualized. Right epididymis:  Normal in size and appearance. Left epididymis:  Normal in size and appearance. Hydrocele: Borderline small simple hydroceles bilaterally, left greater than right. Varicocele:  None visualized. Pulsed Doppler interrogation of both testes demonstrates normal low resistance arterial and venous waveforms bilaterally. IMPRESSION: 1. Normal testes with no evidence of testicular torsion. 2. No evidence of acute epididymitis. 3. Borderline small simple hydroceles bilaterally, left greater than right. Electronically Signed   By: Delbert Phenix M.D.   On: 12/30/2015 13:51      MDM   1. Testicular pain   2. Left testicular pain    Pt c/o scrotal pain, worse on Left side, started after vomiting twice yesterday.  Nausea and vomiting have resolved. No hx of known injury. Denies concern for STDs. UA: unremarkable   Concern for possible testicular torsion.  Pt sent to [facility] for imaging at U/S unavailable at this facility at this time.  Imaging negative for testicular  torsion or epididymitis.   Discussed results with pt over the phone.  Offered to check urine for GC/Chlamydia. Pt declined stating he feels "okay" and not concerned to STDs. Encouraged to alternate cool and warm packs for comfort, may have strained when vomiting yesterday. May take acetaminophen and ibuprofen. Encouraged f/u with PCP in 3-4 days if symptoms not improving, sooner if worsening. Patient verbalized understanding and agreement with treatment plan.    Junius Finner, PA-C 12/30/15 [phone]

## 2016-01-02 ENCOUNTER — Telehealth: Payer: Self-pay | Admitting: *Deleted

## 2016-01-02 NOTE — Telephone Encounter (Signed)
Callback: No answer, LMOM f/u from visit, call back as needed.  

## 2016-01-17 ENCOUNTER — Ambulatory Visit: Payer: Self-pay | Admitting: Sports Medicine

## 2016-01-19 ENCOUNTER — Ambulatory Visit: Payer: Self-pay | Admitting: Family Medicine

## 2016-01-24 ENCOUNTER — Encounter: Payer: Self-pay | Admitting: Family Medicine

## 2016-01-24 ENCOUNTER — Ambulatory Visit (INDEPENDENT_AMBULATORY_CARE_PROVIDER_SITE_OTHER): Payer: BLUE CROSS/BLUE SHIELD | Admitting: Family Medicine

## 2016-01-24 VITALS — BP 120/77 | HR 68 | Wt 158.0 lb

## 2016-01-24 DIAGNOSIS — F900 Attention-deficit hyperactivity disorder, predominantly inattentive type: Secondary | ICD-10-CM

## 2016-01-24 DIAGNOSIS — F909 Attention-deficit hyperactivity disorder, unspecified type: Secondary | ICD-10-CM

## 2016-01-24 DIAGNOSIS — Z23 Encounter for immunization: Secondary | ICD-10-CM | POA: Diagnosis not present

## 2016-01-24 MED ORDER — AMPHETAMINE-DEXTROAMPHET ER 20 MG PO CP24: 20.0000 mg | ORAL_CAPSULE | ORAL | 0 refills | Status: DC

## 2016-01-24 MED ORDER — AMPHETAMINE-DEXTROAMPHETAMINE 10 MG PO TABS: 10.0000 mg | ORAL_TABLET | Freq: Every day | ORAL | 0 refills | Status: DC

## 2016-01-24 NOTE — Progress Notes (Signed)
CC: Seth Walls is a 20 y.o. male is here for Medication Refill   Subjective: HPI:  Follow up adult ADHD: Since restarted on Adderall he feels like it's providing him the benefit he was looking for. It's helped him stay focused at work and remember important dates and obligations. He denies any known side effects. He's happier on a lower dose of extended release since he is no longer experiencing palpitations or pounding heart rate. He denies any sleep disturbance or appetite suppression. Denies any other mental disturbance. Specifically no depression or anxiety   Review Of Systems Outlined In HPI  Past Medical History:  Diagnosis Date  . ADHD (attention deficit hyperactivity disorder)   . Asthma   . Mononucleosis     Past Surgical History:  Procedure Laterality Date  . ELBOW SURGERY     right  . HERNIA REPAIR     Family History  Problem Relation Age of Onset  . Depression Mother   . Hyperlipidemia Mother   . Diabetes Mother     Social History   Social History  . Marital status: Single    Spouse name: N/A  . Number of children: N/A  . Years of education: N/A   Occupational History  . Not on file.   Social History Main Topics  . Smoking status: Current Some Day Smoker    Packs/day: 0.50    Types: E-cigarettes  . Smokeless tobacco: Never Used  . Alcohol use No  . Drug use: No  . Sexual activity: No   Other Topics Concern  . Not on file   Social History Narrative  . No narrative on file     Objective: BP 120/77   Pulse 68   Wt 158 lb (71.7 kg)   BMI 23.33 kg/m   Vital signs reviewed. General: Alert and Oriented, No Acute Distress HEENT: Pupils equal, round, reactive to light. Conjunctivae clear.  External ears unremarkable.  Moist mucous membranes. Lungs: Clear and comfortable work of breathing, speaking in full sentences without accessory muscle use. Cardiac: Regular rate and rhythm.  Neuro: CN II-XII grossly intact, gait normal. Extremities: No  peripheral edema.  Strong peripheral pulses.  Mental Status: No depression, anxiety, nor agitation. Logical though process. Skin: Warm and dry. Assessment & Plan: Seth Walls was seen today for medication refill.  Diagnoses and all orders for this visit:  Adult ADHD  Encounter for immunization -     Flu Vaccine QUAD 36+ mos IM  Other orders -     Discontinue: amphetamine-dextroamphetamine (ADDERALL XR) 20 MG 24 hr capsule; Take 1 capsule (20 mg total) by mouth every morning. -     Discontinue: amphetamine-dextroamphetamine (ADDERALL) 10 MG tablet; Take 1 tablet (10 mg total) by mouth daily. -     Discontinue: amphetamine-dextroamphetamine (ADDERALL XR) 20 MG 24 hr capsule; Take 1 capsule (20 mg total) by mouth every morning. May fill on/after October 3rd 2017 -     Discontinue: amphetamine-dextroamphetamine (ADDERALL) 10 MG tablet; Take 1 tablet (10 mg total) by mouth daily. May fill on/after October 3rd 2017 -     amphetamine-dextroamphetamine (ADDERALL XR) 20 MG 24 hr capsule; Take 1 capsule (20 mg total) by mouth every morning. May fill on/after November 1st 2017 -     amphetamine-dextroamphetamine (ADDERALL) 10 MG tablet; Take 1 tablet (10 mg total) by mouth daily. May fill on/after November 1st 2017   Adult ADHD currently controlled with Adderall. Continuing current regimen with follow-up imaging in 3 months.  He  is now engaged, his girlfriend said yes.  Return if symptoms worsen or fail to improve.

## 2016-05-24 ENCOUNTER — Ambulatory Visit (INDEPENDENT_AMBULATORY_CARE_PROVIDER_SITE_OTHER): Payer: BLUE CROSS/BLUE SHIELD | Admitting: Family Medicine

## 2016-05-24 ENCOUNTER — Encounter: Payer: Self-pay | Admitting: Family Medicine

## 2016-05-24 VITALS — BP 131/84 | HR 77 | Wt 156.0 lb

## 2016-05-24 DIAGNOSIS — Z23 Encounter for immunization: Secondary | ICD-10-CM

## 2016-05-24 DIAGNOSIS — F909 Attention-deficit hyperactivity disorder, unspecified type: Secondary | ICD-10-CM

## 2016-05-24 MED ORDER — AMPHETAMINE-DEXTROAMPHETAMINE 10 MG PO TABS: 10.0000 mg | ORAL_TABLET | Freq: Two times a day (BID) | ORAL | 0 refills | Status: DC

## 2016-05-24 MED ORDER — AMPHETAMINE-DEXTROAMPHETAMINE 10 MG PO TABS: 60.0000 mg | ORAL_TABLET | Freq: Two times a day (BID) | ORAL | 0 refills | Status: DC

## 2016-05-24 NOTE — Patient Instructions (Signed)
Thank you for coming in today. Return in 3 months.  Switch to 2x daily adderall

## 2016-05-24 NOTE — Addendum Note (Signed)
Addended by: Minna AntisBRIGHAM, Muaaz Brau T on: 05/24/2016 02:07 PM   Modules accepted: Orders

## 2016-05-24 NOTE — Progress Notes (Signed)
       Seth Walls is a 21 y.o. male who [facility] today for Follow-up ADHD. Patient is doing well taking Adderall. He takes 20 mg of extended-release Adderall most days and occasionally 10 mg in the evening. He notes his schedule was changed and he liked to use immediately release formulation only. He liked using up to twice daily as needed. He tolerates medication well.   Past Medical History:  Diagnosis Date  . ADHD (attention deficit hyperactivity disorder)   . Asthma   . Mononucleosis    Past Surgical History:  Procedure Laterality Date  . ELBOW SURGERY     right  . HERNIA REPAIR     Social History  Substance Use Topics  . Smoking status: Current Some Day Smoker    Packs/day: 0.50    Types: E-cigarettes  . Smokeless tobacco: Never Used  . Alcohol use No   family history includes Depression in his mother; Diabetes in his mother; Hyperlipidemia in his mother.  ROS as above:  Medications: Current Outpatient Prescriptions  Medication Sig Dispense Refill  . amphetamine-dextroamphetamine (ADDERALL) 10 MG tablet Take 1 tablet (10 mg total) by mouth 2 (two) times daily. 60 tablet 0  . amphetamine-dextroamphetamine (ADDERALL) 10 MG tablet Take 1 tablet (10 mg total) by mouth 2 (two) times daily. 60 tablet 0  . amphetamine-dextroamphetamine (ADDERALL) 10 MG tablet Take 1 tablet (10 mg total) by mouth 2 (two) times daily with a meal. 60 tablet 0   No current facility-administered medications for this visit.    No Known Allergies  Health Maintenance Health Maintenance  Topic Date Due  . HIV Screening  04/27/2009  . TETANUS/TDAP  04/27/2013  . INFLUENZA VACCINE  Completed     Exam:  BP 131/84   Pulse 77   Wt 156 lb (70.8 kg)   BMI 23.04 kg/m  Gen: Well NAD HEENT: EOMI,  MMM Lungs: Normal work of breathing. CTABL Heart: RRR no MRG Abd:  NABS, Soft. Nondistended, Nontender Exts: Brisk capillary refill, warm and well perfused.  Psych: Alert and oriented thought process and affect.   No results found for this or any previous visit (from the past 72 hour(s)). No results found.    Assessment and Plan: 21 y.o. male with ADHD. Doing well. Switch to immediate release Adderall 10 mg twice daily. Return in 3 months.   No orders of the defined types were placed in this encounter.   Discussed warning signs or symptoms. Please see discharge instructions. Patient expresses understanding.

## 2016-09-07 ENCOUNTER — Ambulatory Visit: Payer: BLUE CROSS/BLUE SHIELD | Admitting: Family Medicine

## 2016-09-12 ENCOUNTER — Other Ambulatory Visit: Payer: Self-pay | Admitting: Family Medicine

## 2016-09-17 ENCOUNTER — Ambulatory Visit (INDEPENDENT_AMBULATORY_CARE_PROVIDER_SITE_OTHER): Payer: BLUE CROSS/BLUE SHIELD | Admitting: Family Medicine

## 2016-09-17 VITALS — BP 126/79 | HR 65 | Wt 158.0 lb

## 2016-09-17 DIAGNOSIS — F909 Attention-deficit hyperactivity disorder, unspecified type: Secondary | ICD-10-CM | POA: Diagnosis not present

## 2016-09-17 MED ORDER — AMPHETAMINE-DEXTROAMPHETAMINE 10 MG PO TABS: 10.0000 mg | ORAL_TABLET | Freq: Two times a day (BID) | ORAL | 0 refills | Status: DC

## 2016-09-17 NOTE — Patient Instructions (Signed)
Thank you for coming in today. Recheck in 3 months or sooner if needed.  If you continue to have a problem falling asleep try taking the 2nd dose a little earlier.

## 2016-09-17 NOTE — Progress Notes (Signed)
       Seth Walls is a 21 y.o. male who presents to [facility]: [facility] Medicine today for follow-up ADHD.  Patient is doing well with immediate release Adderall twice daily. He notes occasionally of trouble sleeping if he takes the second dose a little later in the day. He notes the medication and also concentrate both at work and at school and he feels well with no jitteriness anxiety or tics.   Past Medical History:  Diagnosis Date  . ADHD (attention deficit hyperactivity disorder)   . Asthma   . Mononucleosis    Past Surgical History:  Procedure Laterality Date  . ELBOW SURGERY     right  . HERNIA REPAIR     Social History  Substance Use Topics  . Smoking status: Current Some Day Smoker    Packs/day: 0.50    Types: E-cigarettes  . Smokeless tobacco: Never Used  . Alcohol use No   family history includes Depression in his mother; Diabetes in his mother; Hyperlipidemia in his mother.  ROS as above:  Medications: Current Outpatient Prescriptions  Medication Sig Dispense Refill  . amphetamine-dextroamphetamine (ADDERALL) 10 MG tablet Take 1 tablet (10 mg total) by mouth 2 (two) times daily. 60 tablet 0   No current facility-administered medications for this visit.    No Known Allergies  Health Maintenance Health Maintenance  Topic Date Due  . HIV Screening  04/27/2009  . INFLUENZA VACCINE  12/19/2016  . TETANUS/TDAP  05/24/2026     Exam:  BP 126/79   Pulse 65   Wt 158 lb (71.7 kg)   BMI 23.33 kg/m  Gen: Well NAD HEENT: EOMI,  MMM Lungs: Normal work of breathing. CTABL Heart: RRR no MRG Abd: NABS, Soft. Nondistended, Nontender Exts: Brisk capillary refill, warm and well perfused.  Neuro psych: Alert and oriented normal speech thought process gait and coordination.   No results found for this or any previous visit (from the past 72 hour(s)). No results  found.    Assessment and Plan: 21 y.o. male with ADHD doing well continue current regimen and recheck in 3 months.   No orders of the defined types were placed in this encounter.  No orders of the defined types were placed in this encounter.    Discussed warning signs or symptoms. Please see discharge instructions. Patient expresses understanding.

## 2016-10-05 ENCOUNTER — Ambulatory Visit (INDEPENDENT_AMBULATORY_CARE_PROVIDER_SITE_OTHER): Payer: BLUE CROSS/BLUE SHIELD

## 2016-10-05 ENCOUNTER — Ambulatory Visit (INDEPENDENT_AMBULATORY_CARE_PROVIDER_SITE_OTHER): Payer: BLUE CROSS/BLUE SHIELD | Admitting: Physician Assistant

## 2016-10-05 ENCOUNTER — Encounter: Payer: Self-pay | Admitting: Physician Assistant

## 2016-10-05 VITALS — BP 121/80 | HR 62 | Temp 98.3°F | Resp 14 | Wt 160.0 lb

## 2016-10-05 DIAGNOSIS — R062 Wheezing: Secondary | ICD-10-CM | POA: Diagnosis not present

## 2016-10-05 DIAGNOSIS — R05 Cough: Secondary | ICD-10-CM | POA: Diagnosis not present

## 2016-10-05 DIAGNOSIS — J45909 Unspecified asthma, uncomplicated: Secondary | ICD-10-CM | POA: Insufficient documentation

## 2016-10-05 DIAGNOSIS — J4521 Mild intermittent asthma with (acute) exacerbation: Secondary | ICD-10-CM

## 2016-10-05 MED ORDER — MONTELUKAST SODIUM 10 MG PO TABS: 10.0000 mg | ORAL_TABLET | Freq: Every day | ORAL | 3 refills | Status: AC

## 2016-10-05 MED ORDER — ALBUTEROL SULFATE HFA 108 (90 BASE) MCG/ACT IN AERS: 1.0000 | INHALATION_SPRAY | RESPIRATORY_TRACT | 1 refills | Status: DC | PRN

## 2016-10-05 MED ORDER — IPRATROPIUM-ALBUTEROL 0.5-2.5 (3) MG/3ML IN SOLN
3.0000 mL | Freq: Once | RESPIRATORY_TRACT | Status: AC
  Administered 2016-10-05: 3 mL via RESPIRATORY_TRACT

## 2016-10-05 MED ORDER — AZITHROMYCIN 250 MG PO TABS: ORAL_TABLET | ORAL | 0 refills | Status: DC

## 2016-10-05 MED ORDER — PREDNISONE 50 MG PO TABS: ORAL_TABLET | ORAL | 0 refills | Status: DC

## 2016-10-05 NOTE — Progress Notes (Signed)
HPI:                                                                Seth Walls is a 21 y.o. male who [facility] today for cough  Cough  This is a new problem. The current episode started in the past 7 days (x 5 days). The problem has been unchanged. The problem occurs every few minutes. The cough is productive of sputum. Associated symptoms include rhinorrhea and wheezing. Pertinent negatives include no chills, fever, hemoptysis, myalgias, rash or shortness of breath. Associated symptoms comments: + chest tightness. Nothing aggravates the symptoms. Risk factors for lung disease include smoking/tobacco exposure. Treatments tried: antihistamine, Dayquil. The treatment provided no relief. His past medical history is significant for asthma.     Past Medical History:  Diagnosis Date  . ADHD (attention deficit hyperactivity disorder)   . Asthma   . Mononucleosis    Past Surgical History:  Procedure Laterality Date  . ELBOW SURGERY     right  . HERNIA REPAIR     Social History  Substance Use Topics  . Smoking status: Current Some Day Smoker    Packs/day: 0.50    Types: E-cigarettes  . Smokeless tobacco: Never Used  . Alcohol use No   family history includes Depression in his mother; Diabetes in his mother; Hyperlipidemia in his mother.  ROS: negative except as noted in the HPI  Medications: Current Outpatient Prescriptions  Medication Sig Dispense Refill  . amphetamine-dextroamphetamine (ADDERALL) 10 MG tablet Take 1 tablet (10 mg total) by mouth 2 (two) times daily. 60 tablet 0  . amphetamine-dextroamphetamine (ADDERALL) 10 MG tablet Take 1 tablet (10 mg total) by mouth 2 (two) times daily with a meal. 60 tablet 0  . amphetamine-dextroamphetamine (ADDERALL) 10 MG tablet Take 1 tablet (10 mg total) by mouth 2 (two) times daily. 60 tablet 0   No current facility-administered medications for this visit.    No Known  Allergies     Objective:  BP 121/80   Pulse 62   Temp 98.3 F (36.8 C) (Oral)   Wt 160 lb (72.6 kg)   BMI 23.63 kg/m  Gen: well-groomed, cooperative, not ill-appearing, no distress HEENT: normal conjunctiva, canals occluded bilaterally by cerumen, nasal mucosa pink, oropharynx clear, moist mucus membranes, no frontal or maxillary sinus tenderness, neck supple, trachea midline Pulm: Normal work of breathing, normal phonation, breath sounds diminished, expiratory wheezes in the left lower fields CV: Normal rate, regular rhythm, s1 and s2 distinct, no murmurs, clicks or rubs  Neuro: alert and oriented x 3, EOM's intact, no tremor MSK: moving all extremities, normal gait and station, no peripheral edema Lymph: no cervical or tonsillar adenopathy Skin: warm, dry, intact; no rashes or lesions on exposed skin, no cyanosis   No results found for this or any previous visit (from the past 72 hour(s)). No results found.    Assessment and Plan: 21 y.o. male with   1. Mild intermittent asthma with acute exacerbation - Duoneb given in clinic today - montelukast (SINGULAIR) 10 MG tablet; Take 1 tablet (10 mg total) by mouth at bedtime.  Dispense: 90 tablet; Refill: 3 - DG Chest 2 View - predniSONE (DELTASONE) 50 MG tablet;  One tab PO daily for 5 days.  Dispense: 5 tablet; Refill: 0 - azithromycin (ZITHROMAX Z-PAK) 250 MG tablet; Take 2 tablets (500 mg) on  Day 1,  followed by 1 tablet (250 mg) once daily on Days 2 through 5.  Dispense: 6 tablet; Refill: 0 - albuterol (PROVENTIL HFA;VENTOLIN HFA) 108 (90 Base) MCG/ACT inhaler; Inhale 1 puff into the lungs every 4 (four) hours as needed for wheezing.  Dispense: 1 Inhaler; Refill: 1  Patient education and anticipatory guidance given Patient agrees with treatment plan Follow-up as needed if symptoms worsen or fail to improve  Levonne Hubert PA-C

## 2016-10-05 NOTE — Patient Instructions (Addendum)
- Go downstairs for chest x-ray - Take antibiotic and steroid for 5 days as prescribed - Start taking Singulair nightly for asthma/allergies to prevent future exacerbations - Use your rescue inhaler, 1-2 puffs every 6 hours as needed for wheezing/shortness of breath - Follow-up with Dr. Denyse Amassorey in 1 week   Asthma, Acute Bronchospasm Acute bronchospasm caused by asthma is also referred to as an asthma attack. Bronchospasm means your air passages become narrowed. The narrowing is caused by inflammation and tightening of the muscles in the air tubes (bronchi) in your lungs. This can make it hard to breathe or cause you to wheeze and cough. What are the causes? Possible triggers are:  Animal dander from the skin, hair, or feathers of animals.  Dust mites contained in house dust.  Cockroaches.  Pollen from trees or grass.  Mold.  Cigarette or tobacco smoke.  Air pollutants such as dust, household cleaners, hair sprays, aerosol sprays, paint fumes, strong chemicals, or strong odors.  Cold air or weather changes. Cold air may trigger inflammation. Winds increase molds and pollens in the air.  Strong emotions such as crying or laughing hard.  Stress.  Certain medicines such as aspirin or beta-blockers.  Sulfites in foods and drinks, such as dried fruits and wine.  Infections or inflammatory conditions, such as a flu, cold, or inflammation of the nasal membranes (rhinitis).  Gastroesophageal reflux disease (GERD). GERD is a condition where stomach acid backs up into your esophagus.  Exercise or strenuous activity. What are the signs or symptoms?  Wheezing.  Excessive coughing, particularly at night.  Chest tightness.  Shortness of breath. How is this diagnosed? Your health care provider will ask you about your medical history and perform a physical exam. A chest X-ray or blood testing may be performed to look for other causes of your symptoms or other conditions that may have  triggered your asthma attack. How is this treated? Treatment is aimed at reducing inflammation and opening up the airways in your lungs. Most asthma attacks are treated with inhaled medicines. These include quick relief or rescue medicines (such as bronchodilators) and controller medicines (such as inhaled corticosteroids). These medicines are sometimes given through an inhaler or a nebulizer. Systemic steroid medicine taken by mouth or given through an IV tube also can be used to reduce the inflammation when an attack is moderate or severe. Antibiotic medicines are only used if a bacterial infection is present. Follow these instructions at home:  Rest.  Drink plenty of liquids. This helps the mucus to remain thin and be easily coughed up. Only use caffeine in moderation and do not use alcohol until you have recovered from your illness.  Do not smoke. Avoid being exposed to secondhand smoke.  You play a critical role in keeping yourself in good health. Avoid exposure to things that cause you to wheeze or to have breathing problems.  Keep your medicines up-to-date and available. Carefully follow your health care provider's treatment plan.  Take your medicine exactly as prescribed.  When pollen or pollution is bad, keep windows closed and use an air conditioner or go to places with air conditioning.  Asthma requires careful medical care. See your health care provider for a follow-up as advised. If you are more than [redacted] weeks pregnant and you were prescribed any new medicines, let your obstetrician know about the visit and how you are doing. Follow up with your health care provider as directed.  After you have recovered from your asthma attack,  make an appointment with your outpatient doctor to talk about ways to reduce the likelihood of future attacks. If you do not have a doctor who manages your asthma, make an appointment with a primary care doctor to discuss your asthma. Get help right away  if:  You are getting worse.  You have trouble breathing. If severe, call your local emergency services (911 in the [place]  You develop chest pain or discomfort.  You are vomiting.  You are not able to keep fluids down.  You are coughing up yellow, green, brown, or bloody sputum.  You have a fever and your symptoms suddenly get worse.  You have trouble swallowing. This information is not intended to replace advice given to you by your health care provider. Make sure you discuss any questions you have with your health care provider. Document Released: 08/22/2006 Document Revised: 10/19/2015 Document Reviewed: 11/12/2012 Elsevier Interactive Patient Education  2017 [employer].

## 2016-10-08 ENCOUNTER — Ambulatory Visit: Payer: BLUE CROSS/BLUE SHIELD | Admitting: Family Medicine

## 2016-10-11 ENCOUNTER — Ambulatory Visit: Payer: BLUE CROSS/BLUE SHIELD | Admitting: Family Medicine

## 2016-11-29 ENCOUNTER — Encounter (HOSPITAL_COMMUNITY): Payer: Self-pay | Admitting: Emergency Medicine

## 2016-11-29 ENCOUNTER — Ambulatory Visit (HOSPITAL_COMMUNITY)
Admission: EM | Admit: 2016-11-29 | Discharge: 2016-11-29 | Disposition: A | Discharge status: Home / Self Care | Payer: BLUE CROSS/BLUE SHIELD | Attending: Emergency Medicine | Admitting: Emergency Medicine

## 2016-11-29 DIAGNOSIS — J069 Acute upper respiratory infection, unspecified: Secondary | ICD-10-CM | POA: Diagnosis not present

## 2016-11-29 DIAGNOSIS — H6123 Impacted cerumen, bilateral: Secondary | ICD-10-CM

## 2016-11-29 MED ORDER — BENZONATATE 100 MG PO CAPS: 100.0000 mg | ORAL_CAPSULE | Freq: Three times a day (TID) | ORAL | 0 refills | Status: DC

## 2016-11-29 NOTE — ED Provider Notes (Signed)
CSN: [id number]     Arrival date & time 11/29/16  1117 History   None    Chief Complaint  Patient presents with  . URI   (Consider location/radiation/quality/duration/timing/severity/associated sxs/prior Treatment) The history is provided by the patient.  URI  Presenting symptoms: congestion, cough, fatigue, fever and sore throat   Presenting symptoms: no ear pain, no facial pain and no rhinorrhea   Severity:  Moderate Onset quality:  Gradual Duration:  2 days Timing:  Constant Progression:  Unchanged Chronicity:  New Relieved by:  OTC medications Worsened by:  Nothing Associated symptoms: headaches   Associated symptoms: no arthralgias, no myalgias, no neck pain, no sinus pain, no sneezing, no swollen glands and no wheezing     Past Medical History:  Diagnosis Date  . ADHD (attention deficit hyperactivity disorder)   . Asthma   . Mononucleosis    Past Surgical History:  Procedure Laterality Date  . ELBOW SURGERY     right  . HERNIA REPAIR     Family History  Problem Relation Age of Onset  . Depression Mother   . Hyperlipidemia Mother   . Diabetes Mother    Social History  Substance Use Topics  . Smoking status: Former Smoker    Years: 2.00    Types: Cigarettes    Quit date: 04/28/2016  . Smokeless tobacco: Never Used     Comment: <0.25 pack per week  . Alcohol use No    Review of Systems  Constitutional: Positive for fatigue and fever. Negative for chills.  HENT: Positive for congestion and sore throat. Negative for ear pain, rhinorrhea, sinus pain and sneezing.   Respiratory: Positive for cough. Negative for wheezing.   Cardiovascular: Negative.   Gastrointestinal: Negative.   Musculoskeletal: Negative for arthralgias, myalgias and neck pain.  Skin: Negative.   Neurological: Positive for headaches. Negative for light-headedness.    Allergies  Patient has no known allergies.  Home Medications   Prior to Admission medications   Medication Sig Start  Date End Date Taking? Authorizing Provider  albuterol (PROVENTIL HFA;VENTOLIN HFA) 108 (90 Base) MCG/ACT inhaler Inhale 1 puff into the lungs every 4 (four) hours as needed for wheezing. 10/05/16  Yes Carlis Stableummings, Charley Elizabeth, PA-C  amphetamine-dextroamphetamine (ADDERALL) 10 MG tablet Take 1 tablet (10 mg total) by mouth 2 (two) times daily. 09/17/16  Yes Rodolph Bongorey, Evan S, MD  amphetamine-dextroamphetamine (ADDERALL) 10 MG tablet Take 1 tablet (10 mg total) by mouth 2 (two) times daily with a meal. 09/17/16   Rodolph Bongorey, Evan S, MD  amphetamine-dextroamphetamine (ADDERALL) 10 MG tablet Take 1 tablet (10 mg total) by mouth 2 (two) times daily. 09/17/16   Rodolph Bongorey, Evan S, MD  benzonatate (TESSALON) 100 MG capsule Take 1 capsule (100 mg total) by mouth every 8 (eight) hours. 11/29/16   Dorena BodoKennard, Shandora Koogler, NP  montelukast (SINGULAIR) 10 MG tablet Take 1 tablet (10 mg total) by mouth at bedtime. 10/05/16   Carlis Stableummings, Charley Elizabeth, PA-C   Meds Ordered and Administered this Visit  Medications - No data to display  BP 127/72 (BP Location: Right Arm)   Pulse 77   Temp 99 F (37.2 C) (Oral)   SpO2 97%  No data found.   Physical Exam  Constitutional: He is oriented to person, place, and time. He appears well-developed and well-nourished. No distress.  HENT:  Head: Normocephalic and atraumatic.  Right Ear: External ear normal.  Left Ear: External ear normal.  Nose: Nose normal. Right sinus exhibits no maxillary sinus tenderness  and no frontal sinus tenderness. Left sinus exhibits no maxillary sinus tenderness and no frontal sinus tenderness.  Mouth/Throat: Uvula is midline and oropharynx is clear and moist. No oropharyngeal exudate.  Bilateral cerumen impaction  Eyes: Conjunctivae are normal.  Neck: Normal range of motion. Neck supple. No JVD present.  Cardiovascular: Normal rate and regular rhythm.   Pulmonary/Chest: Effort normal and breath sounds normal. No respiratory distress. He has no wheezes.   Abdominal: Soft. Bowel sounds are normal.  Lymphadenopathy:    He has no cervical adenopathy.  Neurological: He is alert and oriented to person, place, and time.  Skin: Skin is warm and dry. Capillary refill takes less than 2 seconds. No rash noted. He is not diaphoretic. No erythema.  Psychiatric: He has a normal mood and affect.  Nursing note and vitals reviewed.   Urgent Care Course     Procedures (including critical care time)  Labs Review Labs Reviewed - No data to display  Imaging Review No results found.     MDM   1. Viral upper respiratory tract infection   2. Bilateral impacted cerumen    Cerumen impaction cleared in clinic, provided counseling to his diagnosis and counseling on over-the-counter therapies for symptom management, started on Tessalon for cough, follow-up with primary care return to clinic as needed.     Dorena Bodo, NP 11/29/16 1204

## 2016-11-29 NOTE — ED Triage Notes (Signed)
Pt reports two days of sinus congestion and pressure, cough and intermittent nausea.  He also reports a fever of 99.9 at home.  He last had NyQuil last night.

## 2016-11-29 NOTE — Discharge Instructions (Signed)
You most likely have a viral URI, this type of infection will not be helped by antibiotics. For cough, I have prescribed a medication called Tessalon. Take 1 tablet every 8 hours as needed for your cough. . ° °In addition to these therapies, I advise rest, plenty of fluids and management of symptoms with over the counter medicines. Over the counter therapies for your symptoms include:Tylenol as needed every 4-6 hours for body aches or fever, not to exceed 4,000 mg a day, Take mucinex or mucinex DM ever 12 hours with a full glass of water, you may use an inhaled steroid such as Flonase, 2 sprays each nostril once a day for congestion, or an antihistamine such as Claritin or Zyrtec once a day. Another alternative for congestion, is a pseudoephedrine containing product available from the pharmacist. Should your symptoms worsen or fail to resolve, follow up with your primary care provider or return to clinic.  °

## 2016-12-31 ENCOUNTER — Encounter: Payer: Self-pay | Admitting: Family Medicine

## 2016-12-31 ENCOUNTER — Ambulatory Visit (INDEPENDENT_AMBULATORY_CARE_PROVIDER_SITE_OTHER): Payer: BLUE CROSS/BLUE SHIELD | Admitting: Family Medicine

## 2016-12-31 VITALS — BP 104/64 | HR 67 | Wt 155.0 lb

## 2016-12-31 DIAGNOSIS — Z23 Encounter for immunization: Secondary | ICD-10-CM

## 2016-12-31 DIAGNOSIS — J4520 Mild intermittent asthma, uncomplicated: Secondary | ICD-10-CM

## 2016-12-31 DIAGNOSIS — F909 Attention-deficit hyperactivity disorder, unspecified type: Secondary | ICD-10-CM

## 2016-12-31 DIAGNOSIS — Z7189 Other specified counseling: Secondary | ICD-10-CM

## 2016-12-31 DIAGNOSIS — Z7184 Encounter for health counseling related to travel: Secondary | ICD-10-CM

## 2016-12-31 MED ORDER — AMPHETAMINE-DEXTROAMPHETAMINE 10 MG PO TABS: 10.0000 mg | ORAL_TABLET | Freq: Two times a day (BID) | ORAL | 0 refills | Status: AC

## 2016-12-31 MED ORDER — TYPHOID VACCINE PO CPDR: 1.0000 | DELAYED_RELEASE_CAPSULE | ORAL | 0 refills | Status: DC

## 2016-12-31 MED ORDER — AMPHETAMINE-DEXTROAMPHETAMINE 10 MG PO TABS: 10.0000 mg | ORAL_TABLET | Freq: Two times a day (BID) | ORAL | 0 refills | Status: DC

## 2016-12-31 NOTE — Progress Notes (Signed)
Seth Walls is a 21 y.o. male who presents to [facility]: Primary Care Sports Medicine today for ADHD and asthma and travel encounter.  ADHD: Patient takes Adderall twice daily. He has this works quite well and allows him to function at work. He denies any tics or tremors and feels well.  Asthma: Patient has a history of intermittent asthma. He he does not have to use his albuterol frequently. He feels well.  Travel encounter. Patient will be traveling to [place] for his honeymoon next month.   Past Medical History:  Diagnosis Date  . ADHD (attention deficit hyperactivity disorder)   . Asthma   . Mononucleosis    Past Surgical History:  Procedure Laterality Date  . ELBOW SURGERY     right  . HERNIA REPAIR     Social History  Substance Use Topics  . Smoking status: Former Smoker    Years: 2.00    Types: Cigarettes    Quit date: 04/28/2016  . Smokeless tobacco: Never Used     Comment: <0.25 pack per week  . Alcohol use No   family history includes Depression in his mother; Diabetes in his mother; Hyperlipidemia in his mother.  ROS as above:  Medications: Current Outpatient Prescriptions  Medication Sig Dispense Refill  . albuterol (PROVENTIL HFA;VENTOLIN HFA) 108 (90 Base) MCG/ACT inhaler Inhale 1 puff into the lungs every 4 (four) hours as needed for wheezing. 1 Inhaler 1  . amphetamine-dextroamphetamine (ADDERALL) 10 MG tablet Take 1 tablet (10 mg total) by mouth 2 (two) times daily with a meal. 60 tablet 0  . [START ON 03/02/2017] amphetamine-dextroamphetamine (ADDERALL) 10 MG tablet Take 1 tablet (10 mg total) by mouth 2 (two) times daily. 60 tablet 0  . [START ON 01/31/2017] amphetamine-dextroamphetamine (ADDERALL) 10 MG tablet Take 1 tablet (10 mg total) by mouth 2 (two) times daily. 60 tablet 0  . montelukast (SINGULAIR) 10 MG tablet Take 1 tablet (10 mg total) by mouth at  bedtime. 90 tablet 3  . typhoid (VIVOTIF) DR capsule Take 1 capsule by mouth every other day. 4 capsule 0   No current facility-administered medications for this visit.    No Known Allergies  Health Maintenance Health Maintenance  Topic Date Due  . HIV Screening  04/27/2009  . INFLUENZA VACCINE  12/19/2016  . TETANUS/TDAP  05/24/2026     Exam:  BP 104/64   Pulse 67   Wt 155 lb (70.3 kg)   BMI 22.89 kg/m  Gen: Well NAD HEENT: EOMI,  MMM Lungs: Normal work of breathing. CTABL Heart: RRR no MRG Abd: NABS, Soft. Nondistended, Nontender Exts: Brisk capillary refill, warm and well perfused.  Psych: Alert and oriented normal speech thought process and affect.   No results found for this or any previous visit (from the past 72 hour(s)). No results found.    Assessment and Plan: 21 y.o. male with  ADHD doing well continue Adderall recheck in 3 months.  Asthma doing well continued intermittent albuterol. Recheck as needed. Recommend flu vaccine this fall.  Travel encounter: Patient is up-to-date with hepatitis B.  Hepatitis A and typhoid are recommended. Patient has had one of the 2 hepatitis A vaccines in 2012. We'll administer the second one today. Additionally we'll prescribe oral typhoid vaccine today as well. Recheck as needed.   Orders Placed This Encounter  Procedures  . Hepatitis A vaccine adult IM   Meds ordered this encounter  Medications  . amphetamine-dextroamphetamine (  ADDERALL) 10 MG tablet    Sig: Take 1 tablet (10 mg total) by mouth 2 (two) times daily with a meal.    Dispense:  60 tablet    Refill:  0    Fill today  . amphetamine-dextroamphetamine (ADDERALL) 10 MG tablet    Sig: Take 1 tablet (10 mg total) by mouth 2 (two) times daily.    Dispense:  60 tablet    Refill:  0    Fill in 60 days  . amphetamine-dextroamphetamine (ADDERALL) 10 MG tablet    Sig: Take 1 tablet (10 mg total) by mouth 2 (two) times daily.    Dispense:  60 tablet    Refill:   0    Fill in 30 days  . typhoid (VIVOTIF) DR capsule    Sig: Take 1 capsule by mouth every other day.    Dispense:  4 capsule    Refill:  0     Discussed warning signs or symptoms. Please see discharge instructions. Patient expresses understanding.  I spent 25 minutes with this patient, greater than 50% was face-to-face time counseling regarding the above diagnosis.

## 2016-12-31 NOTE — Patient Instructions (Addendum)
Thank you for coming in today. Continue adderall.  Recheck with me in about 3 months.  Continue albuterol as needed for asthma.  Get the flu shot.    Have fun in [place].  Take the typhoid oral vaccine about 1 month before you travel.   You will get your last Hep A vaccine today.

## 2017-01-23 DIAGNOSIS — D2239 Melanocytic nevi of other parts of face: Secondary | ICD-10-CM | POA: Diagnosis not present

## 2017-01-23 DIAGNOSIS — D1801 Hemangioma of skin and subcutaneous tissue: Secondary | ICD-10-CM | POA: Diagnosis not present

## 2017-01-23 DIAGNOSIS — D485 Neoplasm of uncertain behavior of skin: Secondary | ICD-10-CM | POA: Diagnosis not present

## 2017-04-02 ENCOUNTER — Ambulatory Visit: Payer: BLUE CROSS/BLUE SHIELD | Admitting: Family Medicine

## 2017-04-02 DIAGNOSIS — Z0189 Encounter for other specified special examinations: Secondary | ICD-10-CM

## 2017-04-03 NOTE — Progress Notes (Unsigned)
Pt dismissed from practice due to no shows.

## 2017-05-18 ENCOUNTER — Encounter: Payer: Self-pay | Admitting: Emergency Medicine

## 2017-05-18 ENCOUNTER — Emergency Department (INDEPENDENT_AMBULATORY_CARE_PROVIDER_SITE_OTHER)
Admission: EM | Admit: 2017-05-18 | Discharge: 2017-05-18 | Disposition: A | Discharge status: Home / Self Care | Payer: BLUE CROSS/BLUE SHIELD | Source: Home / Self Care | Attending: Family Medicine | Admitting: Family Medicine

## 2017-05-18 ENCOUNTER — Emergency Department: Payer: BLUE CROSS/BLUE SHIELD

## 2017-05-18 DIAGNOSIS — R109 Unspecified abdominal pain: Secondary | ICD-10-CM | POA: Diagnosis not present

## 2017-05-18 MED ORDER — CYCLOBENZAPRINE HCL 10 MG PO TABS: 10.0000 mg | ORAL_TABLET | Freq: Two times a day (BID) | ORAL | 0 refills | Status: DC | PRN

## 2017-05-18 NOTE — ED Provider Notes (Signed)
[facility]    CSN: [id number] Arrival date & time: 05/18/17  1642     History   Chief Complaint Chief Complaint  Patient presents with  . Flank Pain    HPI Seth Walls is a 21 y.o. male.   HPI  Seth Walls is a 21 y.o. male presenting to UC with c/o 2 days of intermittent Left flank pain that is sharp in nature. Pain lasts for a few seconds at a time but today pain is most constant and worse. Pain is 2/10.  He did have nausea the last 2 days w/o vomiting but nausea has resolved. He has not tried anything for pain because it has been so intermittent and brief.  Denies known injury but does lift boxes of fries at work.  Denies hx of kidney stones. No change in urine.  Denies rashes or bruising to that side. No other symptoms.    Past Medical History:  Diagnosis Date  . ADHD (attention deficit hyperactivity disorder)   . Asthma   . Mononucleosis     Patient Active Problem List   Diagnosis Date Noted  . Asthma 10/05/2016  . Adult ADHD 08/10/2008    Past Surgical History:  Procedure Laterality Date  . ELBOW SURGERY     right  . HERNIA REPAIR         Home Medications    Prior to Admission medications   Medication Sig Start Date End Date Taking? Authorizing Provider  albuterol (PROVENTIL HFA;VENTOLIN HFA) 108 (90 Base) MCG/ACT inhaler Inhale 1 puff into the lungs every 4 (four) hours as needed for wheezing. 10/05/16   Carlis Stable, PA-C  amphetamine-dextroamphetamine (ADDERALL) 10 MG tablet Take 1 tablet (10 mg total) by mouth 2 (two) times daily with a meal. 12/31/16   Rodolph Bong, MD  amphetamine-dextroamphetamine (ADDERALL) 10 MG tablet Take 1 tablet (10 mg total) by mouth 2 (two) times daily. 03/02/17   Rodolph Bong, MD  amphetamine-dextroamphetamine (ADDERALL) 10 MG tablet Take 1 tablet (10 mg total) by mouth 2 (two) times daily. 01/31/17   Rodolph Bong, MD  cyclobenzaprine (FLEXERIL) 10 MG tablet Take 1 tablet (10 mg total) by  mouth 2 (two) times daily as needed. 05/18/17   Lurene Shadow, PA-C  montelukast (SINGULAIR) 10 MG tablet Take 1 tablet (10 mg total) by mouth at bedtime. 10/05/16   Carlis Stable, PA-C  typhoid (VIVOTIF) DR capsule Take 1 capsule by mouth every other day. 12/31/16   Rodolph Bong, MD    Family History Family History  Problem Relation Age of Onset  . Depression Mother   . Hyperlipidemia Mother   . Diabetes Mother     Social History Social History   Tobacco Use  . Smoking status: Former Smoker    Years: 2.00    Types: Cigarettes, E-cigarettes    Last attempt to quit: 04/28/2016    Years since quitting: 1.0  . Smokeless tobacco: Current User  . Tobacco comment: <0.25 pack per week  Substance Use Topics  . Alcohol use: No  . Drug use: No     Allergies   Patient has no known allergies.   Review of Systems Review of Systems  Constitutional: Negative for chills and fever.  HENT: Negative for congestion, ear pain, sore throat, trouble swallowing and voice change.   Respiratory: Negative for cough and shortness of breath.   Cardiovascular: Negative for chest pain and palpitations.  Gastrointestinal: Negative for abdominal pain,  diarrhea, nausea and vomiting.  Genitourinary: Positive for flank pain ( Left). Negative for decreased urine volume, dysuria, frequency, genital sores, hematuria and testicular pain.  Musculoskeletal: Positive for back pain (mild mid back). Negative for arthralgias and myalgias.  Skin: Negative for rash.     Physical Exam Triage Vital Signs ED Triage Vitals  Enc Vitals Group     BP 05/18/17 1656 128/74     Pulse Rate 05/18/17 1656 62     Resp --      Temp 05/18/17 1656 98.5 F (36.9 C)     Temp Source 05/18/17 1656 Oral     SpO2 05/18/17 1656 100 %     Weight 05/18/17 1657 154 lb 8 oz (70.1 kg)     Height 05/18/17 1657 5\' 8"  (1.727 m)     Head Circumference --      Peak Flow --      Pain Score 05/18/17 1657 2     Pain Loc --        Pain Edu? --      Excl. in GC? --    No data found.  Updated Vital Signs BP 128/74 (BP Location: Right Arm)   Pulse 62   Temp 98.5 F (36.9 C) (Oral)   Ht 5\' 8"  (1.727 m)   Wt 154 lb 8 oz (70.1 kg)   SpO2 100%   BMI 23.49 kg/m   Visual Acuity Right Eye Distance:   Left Eye Distance:   Bilateral Distance:    Right Eye Near:   Left Eye Near:    Bilateral Near:     Physical Exam  Constitutional: He is oriented to person, place, and time. He appears well-developed and well-nourished. No distress.  HENT:  Head: Normocephalic and atraumatic.  Eyes: EOM are normal.  Neck: Normal range of motion. Neck supple.  Cardiovascular: Normal rate and regular rhythm.  Pulmonary/Chest: Effort normal and breath sounds normal. No stridor. No respiratory distress. He has no wheezes. He has no rales. He exhibits no tenderness.  Abdominal: Soft. He exhibits no distension and no mass. There is tenderness (Left flank). There is no rebound and no guarding.    Musculoskeletal: Normal range of motion. He exhibits tenderness. He exhibits no edema.       Thoracic back: He exhibits tenderness.       Back:  No midline spinal tenderness. Mild tenderness to mid back, worse in Left flank.  Neurological: He is alert and oriented to person, place, and time.  Skin: Skin is warm and dry. No rash noted. He is not diaphoretic. No erythema.  Psychiatric: He has a normal mood and affect. His behavior is normal.  Nursing note and vitals reviewed.    UC Treatments / Results  Labs (all labs ordered are listed, but only abnormal results are displayed) Labs Reviewed - No data to display  EKG  EKG Interpretation None       Radiology No results found.  Procedures Procedures (including critical care time)  Medications Ordered in UC Medications - No data to display   Initial Impression / Assessment and Plan / UC Course  I have reviewed the triage vital signs and the nursing notes.  Pertinent  labs & imaging results that were available during my care of the patient were reviewed by me and considered in my medical decision making (see chart for details).     Hx and exam most c/w muscle strain Will try trial of Flexeril Alternate acetaminophen, ibuprofen, ice and  heat F/u with PCP in 1 week if not improving.   Final Clinical Impressions(s) / UC Diagnoses   Final diagnoses:  Left flank pain    ED Discharge Orders        Ordered    cyclobenzaprine (FLEXERIL) 10 MG tablet  2 times daily PRN     05/18/17 1713       Controlled Substance Prescriptions [place] Controlled Substance Registry consulted? Not Applicable   Rolla Platehelps, Naleyah Ohlinger O, PA-C 05/18/17 1723

## 2017-05-18 NOTE — Discharge Instructions (Signed)
°  You may take 500mg  acetaminophen every 4-6 hours or in combination with ibuprofen 400-600mg  every 6-8 hours as needed for pain and inflammation.   Flexeril (cyclobenzaprine) is a muscle relaxer and may cause drowsiness. Do not drink alcohol, drive, or operate heavy machinery while taking.

## 2017-05-18 NOTE — ED Triage Notes (Signed)
Patient complaining about pain in his left side, started 2 days ago, comes and goes, today was more constant and worse.  No vomiting, nausea last 2 days.

## 2017-05-22 ENCOUNTER — Telehealth: Payer: Self-pay

## 2017-05-22 NOTE — Telephone Encounter (Signed)
Spoke with patient, feeling much better.  Will follow up as needed. 

## 2017-05-23 ENCOUNTER — Emergency Department (INDEPENDENT_AMBULATORY_CARE_PROVIDER_SITE_OTHER): Payer: BLUE CROSS/BLUE SHIELD

## 2017-05-23 ENCOUNTER — Other Ambulatory Visit: Payer: Self-pay

## 2017-05-23 ENCOUNTER — Emergency Department (INDEPENDENT_AMBULATORY_CARE_PROVIDER_SITE_OTHER)
Admission: EM | Admit: 2017-05-23 | Discharge: 2017-05-23 | Disposition: A | Discharge status: Home / Self Care | Payer: BLUE CROSS/BLUE SHIELD | Source: Home / Self Care | Attending: Family Medicine | Admitting: Family Medicine

## 2017-05-23 DIAGNOSIS — S60221A Contusion of right hand, initial encounter: Secondary | ICD-10-CM | POA: Diagnosis not present

## 2017-05-23 DIAGNOSIS — M25541 Pain in joints of right hand: Secondary | ICD-10-CM

## 2017-05-23 DIAGNOSIS — M79641 Pain in right hand: Secondary | ICD-10-CM

## 2017-05-23 DIAGNOSIS — M25531 Pain in right wrist: Secondary | ICD-10-CM

## 2017-05-23 NOTE — Discharge Instructions (Signed)
  You may take 500mg acetaminophen every 4-6 hours or in combination with ibuprofen 400-600mg every 6-8 hours as needed for pain and inflammation.  
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
## 2017-06-14 DIAGNOSIS — F902 Attention-deficit hyperactivity disorder, combined type: Secondary | ICD-10-CM | POA: Diagnosis not present

## 2017-07-18 DIAGNOSIS — T300 Burn of unspecified body region, unspecified degree: Secondary | ICD-10-CM | POA: Diagnosis not present

## 2017-08-07 ENCOUNTER — Other Ambulatory Visit: Payer: Self-pay

## 2017-08-07 ENCOUNTER — Emergency Department (INDEPENDENT_AMBULATORY_CARE_PROVIDER_SITE_OTHER)
Admission: EM | Admit: 2017-08-07 | Discharge: 2017-08-07 | Disposition: A | Discharge status: Home / Self Care | Payer: BLUE CROSS/BLUE SHIELD | Source: Home / Self Care | Attending: Family Medicine | Admitting: Family Medicine

## 2017-08-07 DIAGNOSIS — J029 Acute pharyngitis, unspecified: Secondary | ICD-10-CM

## 2017-08-07 DIAGNOSIS — J069 Acute upper respiratory infection, unspecified: Secondary | ICD-10-CM

## 2017-08-07 LAB — POCT RAPID STREP A (OFFICE): Rapid Strep A Screen: NEGATIVE

## 2017-08-07 MED ORDER — AZITHROMYCIN 250 MG PO TABS: ORAL_TABLET | ORAL | 0 refills | Status: DC

## 2017-08-07 MED ORDER — PREDNISONE 20 MG PO TABS: ORAL_TABLET | ORAL | 0 refills | Status: DC

## 2017-08-07 NOTE — ED Triage Notes (Signed)
Pt c/o sore throat since Sunday. Stomach pains but no vomiting. Some greenish drainage when blowing nose. Has taken OTC meds for congestion as needed.

## 2017-08-07 NOTE — Discharge Instructions (Signed)
Take plain guaifenesin (1200mg  extended release tabs such as Mucinex) twice daily, with plenty of water, for cough and congestion.  May add Pseudoephedrine (30mg , one or two every 4 to 6 hours) for sinus congestion.  Get adequate rest.   May use Afrin nasal spray (or generic oxymetazoline) each morning for about 5 days and then discontinue.  Also recommend using saline nasal spray several times daily and saline nasal irrigation (AYR is a common brand).  Use Flonase nasal spray each morning after using Afrin nasal spray and saline nasal irrigation. Try warm salt water gargles for sore throat.  Stop all antihistamines for now, and other non-prescription cough/cold preparations. Continue albuterol inhaler as needed. May take Delsym Cough Suppressant at bedtime for nighttime cough.  Begin Azithromycin if not improving about one week or if persistent fever develops

## 2017-08-07 NOTE — ED Provider Notes (Signed)
[facility]    CSN: [id number] Arrival date & time: 08/07/17  1510     History   Chief Complaint Chief Complaint  Patient presents with  . Sore Throat    HPI Seth Walls is a 22 y.o. male.   Patient complains of four day history of typical cold-like symptoms developing over several days, including mild sore throat, sinus congestion, fatigue, and occasional cough that started today.  No fevers, chills, and sweats.  He has a past history of exercise asthma.  The history is provided by the patient.    Past Medical History:  Diagnosis Date  . ADHD (attention deficit hyperactivity disorder)   . Asthma   . Mononucleosis     Patient Active Problem List   Diagnosis Date Noted  . Asthma 10/05/2016  . Adult ADHD 08/10/2008    Past Surgical History:  Procedure Laterality Date  . ELBOW SURGERY     right  . HERNIA REPAIR         Home Medications    Prior to Admission medications   Medication Sig Start Date End Date Taking? Authorizing Provider  albuterol (PROVENTIL HFA;VENTOLIN HFA) 108 (90 Base) MCG/ACT inhaler Inhale 1 puff into the lungs every 4 (four) hours as needed for wheezing. 10/05/16   Carlis Stableummings, Charley Elizabeth, PA-C  amphetamine-dextroamphetamine (ADDERALL) 10 MG tablet Take 1 tablet (10 mg total) by mouth 2 (two) times daily. 03/02/17   Rodolph Bongorey, Evan S, MD  azithromycin (ZITHROMAX Z-PAK) 250 MG tablet Take 2 tabs today; then begin one tab once daily for 4 more days. (Rx void after 08/15/17) 08/07/17   Lattie HawBeese, Fauna Neuner A, MD  montelukast (SINGULAIR) 10 MG tablet Take 1 tablet (10 mg total) by mouth at bedtime. 10/05/16   Carlis Stableummings, Charley Elizabeth, PA-C  predniSONE (DELTASONE) 20 MG tablet Take one tab by mouth twice daily for 5 days, then one daily for 3 days. Take with food. 08/07/17   Lattie HawBeese, Malesha Suliman A, MD    Family History Family History  Problem Relation Age of Onset  . Depression Mother   . Hyperlipidemia Mother   . Diabetes Mother      Social History Social History   Tobacco Use  . Smoking status: Former Smoker    Years: 2.00    Types: Cigarettes, E-cigarettes    Last attempt to quit: 04/28/2016    Years since quitting: 1.2  . Smokeless tobacco: Current User  . Tobacco comment: <0.25 pack per week  Substance Use Topics  . Alcohol use: Yes    Comment: A few drinks a month  . Drug use: No     Allergies   Patient has no known allergies.   Review of Systems Review of Systems + sore throat ? cough No pleuritic pain No wheezing + nasal congestion + post-nasal drainage No sinus pain/pressure No itchy/red eyes No earache No hemoptysis No SOB No fever, ? chills No nausea No vomiting No abdominal pain No diarrhea No urinary symptoms No skin rash + fatigue No myalgias No headache Used OTC meds without relief   Physical Exam Triage Vital Signs ED Triage Vitals  Enc Vitals Group     BP 08/07/17 1533 115/75     Pulse Rate 08/07/17 1533 75     Resp 08/07/17 1533 16     Temp 08/07/17 1533 98.4 F (36.9 C)     Temp Source 08/07/17 1533 Oral     SpO2 08/07/17 1533 100 %     Weight 08/07/17 1534  153 lb (69.4 kg)     Height 08/07/17 1534 5\' 10"  (1.778 m)     Head Circumference --      Peak Flow --      Pain Score 08/07/17 1534 0     Pain Loc --      Pain Edu? --      Excl. in GC? --    No data found.  Updated Vital Signs BP 115/75 (BP Location: Right Arm)   Pulse 75   Temp 98.4 F (36.9 C) (Oral)   Resp 16   Ht 5\' 10"  (1.778 m)   Wt 153 lb (69.4 kg)   SpO2 100%   BMI 21.95 kg/m   Visual Acuity Right Eye Distance:   Left Eye Distance:   Bilateral Distance:    Right Eye Near:   Left Eye Near:    Bilateral Near:     Physical Exam Nursing notes and Vital Signs reviewed. Appearance:  Patient appears stated age, and in no acute distress Eyes:  Pupils are equal, round, and reactive to light and accomodation.  Extraocular movement is intact.  Conjunctivae are not inflamed   Ears:  Right canal occluded with cerumen.  Left canal and tympanic membrane normal. Nose:  Mildly congested turbinates.  No sinus tenderness.    Pharynx:  Uvula slightly erythematous Neck:  Supple.  Enlarged posterior/lateral nodes are palpated bilaterally, tender to palpation on the left.   Lungs:  Clear to auscultation.  Breath sounds are equal.  Moving air well. Heart:  Regular rate and rhythm without murmurs, rubs, or gallops.  Abdomen:  Nontender without masses or hepatosplenomegaly.  Bowel sounds are present.  No CVA or flank tenderness.  Extremities:  No edema.  Skin:  No rash present.    UC Treatments / Results  Labs (all labs ordered are listed, but only abnormal results are displayed) Labs Reviewed  POCT RAPID STREP A (OFFICE) negative    EKG  EKG Interpretation None       Radiology No results found.  Procedures Procedures (including critical care time)  Medications Ordered in UC Medications - No data to display   Initial Impression / Assessment and Plan / UC Course  I have reviewed the triage vital signs and the nursing notes.  Pertinent labs & imaging results that were available during my care of the patient were reviewed by me and considered in my medical decision making (see chart for details).    There is no evidence of bacterial infection today.   Begin prednisone burst/taper. Take plain guaifenesin (1200mg  extended release tabs such as Mucinex) twice daily, with plenty of water, for cough and congestion.  May add Pseudoephedrine (30mg , one or two every 4 to 6 hours) for sinus congestion.  Get adequate rest.   May use Afrin nasal spray (or generic oxymetazoline) each morning for about 5 days and then discontinue.  Also recommend using saline nasal spray several times daily and saline nasal irrigation (AYR is a common brand).  Use Flonase nasal spray each morning after using Afrin nasal spray and saline nasal irrigation. Try warm salt water gargles for  sore throat.  Stop all antihistamines for now, and other non-prescription cough/cold preparations. Continue albuterol inhaler as needed. May take Delsym Cough Suppressant at bedtime for nighttime cough.  Begin Azithromycin if not improving about one week or if persistent fever develops (Given a prescription to hold, with an expiration date)  Followup with Family Doctor if not improved in about 10 days.  Final Clinical Impressions(s) / UC Diagnoses   Final diagnoses:  Pharyngitis, unspecified etiology  Viral URI    ED Discharge Orders        Ordered    predniSONE (DELTASONE) 20 MG tablet     08/07/17 1634    azithromycin (ZITHROMAX Z-PAK) 250 MG tablet     08/07/17 1634           Lattie Haw, MD 08/10/17 1528

## 2017-09-17 DIAGNOSIS — F902 Attention-deficit hyperactivity disorder, combined type: Secondary | ICD-10-CM | POA: Diagnosis not present

## 2017-09-24 ENCOUNTER — Emergency Department (INDEPENDENT_AMBULATORY_CARE_PROVIDER_SITE_OTHER)
Admission: EM | Admit: 2017-09-24 | Discharge: 2017-09-24 | Disposition: A | Discharge status: Home / Self Care | Payer: BLUE CROSS/BLUE SHIELD | Source: Home / Self Care | Attending: Family Medicine | Admitting: Family Medicine

## 2017-09-24 ENCOUNTER — Encounter: Payer: Self-pay | Admitting: Emergency Medicine

## 2017-09-24 DIAGNOSIS — J029 Acute pharyngitis, unspecified: Secondary | ICD-10-CM

## 2017-09-24 LAB — POCT RAPID STREP A (OFFICE): Rapid Strep A Screen: NEGATIVE

## 2017-09-24 MED ORDER — PENICILLIN V POTASSIUM 500 MG PO TABS: ORAL_TABLET | ORAL | 0 refills | Status: DC

## 2017-09-24 NOTE — Discharge Instructions (Addendum)
Try warm salt water gargles for sore throat.  ?May take Ibuprofen 200mg, 4 tabs every 8 hours with food.  ?
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
## 2017-09-24 NOTE — ED Provider Notes (Signed)
[facility]    CSN: [id number] Arrival date & time: 09/24/17  1205     History   Chief Complaint Chief Complaint  Patient presents with  . Headache  . Sore Throat    HPI Seth Walls is a 22 y.o. male.   Patient complains of persistent sore throat for 1 week.  During the past 5 days he has had a headache and chills, but no nasal congestion or cough.  His son was diagnosed with strep pharyngitis yesterday.  The history is provided by the patient.    Past Medical History:  Diagnosis Date  . ADHD (attention deficit hyperactivity disorder)   . Asthma   . Mononucleosis     Patient Active Problem List   Diagnosis Date Noted  . Asthma 10/05/2016  . Adult ADHD 08/10/2008    Past Surgical History:  Procedure Laterality Date  . ELBOW SURGERY     right  . HERNIA REPAIR         Home Medications    Prior to Admission medications   Medication Sig Start Date End Date Taking? Authorizing Provider  albuterol (PROVENTIL HFA;VENTOLIN HFA) 108 (90 Base) MCG/ACT inhaler Inhale 1 puff into the lungs every 4 (four) hours as needed for wheezing. 10/05/16   Carlis Stable, PA-C  amphetamine-dextroamphetamine (ADDERALL) 10 MG tablet Take 1 tablet (10 mg total) by mouth 2 (two) times daily. 03/02/17   Rodolph Bong, MD  azithromycin (ZITHROMAX Z-PAK) 250 MG tablet Take 2 tabs today; then begin one tab once daily for 4 more days. (Rx void after 08/15/17) 08/07/17   Lattie Haw, MD  montelukast (SINGULAIR) 10 MG tablet Take 1 tablet (10 mg total) by mouth at bedtime. 10/05/16   Carlis Stable, PA-C  penicillin v potassium (VEETID) 500 MG tablet Take one tab by mouth twice daily for 10 days 09/24/17   Lattie Haw, MD  predniSONE (DELTASONE) 20 MG tablet Take one tab by mouth twice daily for 5 days, then one daily for 3 days. Take with food. 08/07/17   Lattie Haw, MD    Family History Family History  Problem Relation Age of Onset  .  Depression Mother   . Hyperlipidemia Mother   . Diabetes Mother     Social History Social History   Tobacco Use  . Smoking status: Former Smoker    Years: 2.00    Types: Cigarettes, E-cigarettes    Last attempt to quit: 04/28/2016    Years since quitting: 1.4  . Smokeless tobacco: Current User  . Tobacco comment: <0.25 pack per week  Substance Use Topics  . Alcohol use: Yes    Comment: A few drinks a month  . Drug use: No     Allergies   Patient has no known allergies.   Review of Systems Review of Systems + sore throat No cough No pleuritic pain No wheezing No nasal congestion No post-nasal drainage No sinus pain/pressure No itchy/red eyes No earache No hemoptysis No SOB No fever, + chills No nausea No vomiting No abdominal pain No diarrhea No urinary symptoms No skin rash + fatigue No myalgias + headache Used OTC meds without relief   Physical Exam Triage Vital Signs ED Triage Vitals  Enc Vitals Group     BP 09/24/17 1233 124/79     Pulse Rate 09/24/17 1233 71     Resp --      Temp 09/24/17 1233 97.6 F (36.4 C)  Temp Source 09/24/17 1233 Oral     SpO2 09/24/17 1233 100 %     Weight 09/24/17 1234 149 lb (67.6 kg)     Height 09/24/17 1234  (1.727 m)     Head Circumference --      Peak Flow --      Pain Score 09/24/17 1234 5     Pain Loc --      Pain Edu? --      Excl. in GC? --    No data found.  Updated Vital Signs BP 124/79 (BP Location: Left Arm)   Pulse 71   Temp 97.6 F (36.4 C) (Oral)   Ht  (1.727 m)   Wt 149 lb (67.6 kg)   SpO2 100%   BMI 22.66 kg/m   Visual Acuity Right Eye Distance:   Left Eye Distance:   Bilateral Distance:    Right Eye Near:   Left Eye Near:    Bilateral Near:     Physical Exam Nursing notes and Vital Signs reviewed. Appearance:  Patient appears stated age, and in no acute distress Eyes:  Pupils are equal, round, and reactive to light and accomodation.  Extraocular movement is  intact.  Conjunctivae are not inflamed  Ears:  Canals normal.  Tympanic membranes normal.  Nose:   Normal turbinates.  No sinus tenderness.   Pharynx:  Uvula erythematous Neck:  Supple.  Tender right tonsillar node. Lungs:  Clear to auscultation.  Breath sounds are equal.  Moving air well. Heart:  Regular rate and rhythm without murmurs, rubs, or gallops.  Abdomen:  Nontender without masses or hepatosplenomegaly.  Bowel sounds are present.  No CVA or flank tenderness.  Extremities:  No edema.  Skin:  No rash present.    UC Treatments / Results  Labs (all labs ordered are listed, but only abnormal results are displayed) Labs Reviewed  POCT RAPID STREP A (OFFICE) negative    EKG None  Radiology No results found.  Procedures Procedures (including critical care time)  Medications Ordered in UC Medications - No data to display  Initial Impression / Assessment and Plan / UC Course  I have reviewed the triage vital signs and the nursing notes.  Pertinent labs & imaging results that were available during my care of the patient were reviewed by me and considered in my medical decision making (see chart for details).    Suspect strep pharyngitis. Begin empiric PenVK Followup with Family Doctor if not improved in 7 to 10 days.   Final Clinical Impressions(s) / UC Diagnoses   Final diagnoses:  Pharyngitis, unspecified etiology     Discharge Instructions     Try warm salt water gargles for sore throat.  May take Ibuprofen , 4 tabs every 8 hours with food.     ED Prescriptions    Medication Sig Dispense Auth. Provider   penicillin v potassium (VEETID) 500 MG tablet Take one tab by mouth twice daily for 10 days 20 tablet Lattie Haw, MD        Lattie Haw, MD 09/26/17 1155

## 2017-09-25 ENCOUNTER — Telehealth: Payer: Self-pay | Admitting: Emergency Medicine

## 2017-09-25 LAB — STREP A DNA PROBE: Group A Strep Probe: NOT DETECTED

## 2017-09-25 NOTE — Telephone Encounter (Signed)
Left message on cell for patient with negative strep results. Instructed him to call or follow up with PCP if needed.

## 2017-11-29 DIAGNOSIS — H6121 Impacted cerumen, right ear: Secondary | ICD-10-CM | POA: Diagnosis not present

## 2018-01-10 DIAGNOSIS — J029 Acute pharyngitis, unspecified: Secondary | ICD-10-CM | POA: Diagnosis not present

## 2018-01-10 DIAGNOSIS — R05 Cough: Secondary | ICD-10-CM | POA: Diagnosis not present

## 2018-02-20 DIAGNOSIS — J45901 Unspecified asthma with (acute) exacerbation: Secondary | ICD-10-CM | POA: Diagnosis not present

## 2018-03-05 DIAGNOSIS — J019 Acute sinusitis, unspecified: Secondary | ICD-10-CM | POA: Diagnosis not present

## 2018-03-05 DIAGNOSIS — R05 Cough: Secondary | ICD-10-CM | POA: Diagnosis not present

## 2018-07-30 ENCOUNTER — Other Ambulatory Visit: Payer: Self-pay

## 2018-07-30 ENCOUNTER — Encounter (HOSPITAL_COMMUNITY): Payer: Self-pay | Admitting: Emergency Medicine

## 2018-07-30 ENCOUNTER — Ambulatory Visit (HOSPITAL_COMMUNITY)
Admission: EM | Admit: 2018-07-30 | Discharge: 2018-07-30 | Disposition: A | Discharge status: Home / Self Care | Payer: BLUE CROSS/BLUE SHIELD | Attending: Family Medicine | Admitting: Family Medicine

## 2018-07-30 DIAGNOSIS — B9789 Other viral agents as the cause of diseases classified elsewhere: Secondary | ICD-10-CM | POA: Diagnosis not present

## 2018-07-30 DIAGNOSIS — J069 Acute upper respiratory infection, unspecified: Secondary | ICD-10-CM | POA: Diagnosis not present

## 2018-07-30 MED ORDER — FLUTICASONE PROPIONATE 50 MCG/ACT NA SUSP: 1.0000 | Freq: Every day | NASAL | 0 refills | Status: AC

## 2018-07-30 MED ORDER — BENZONATATE 200 MG PO CAPS
200.0000 mg | ORAL_CAPSULE | Freq: Three times a day (TID) | ORAL | 0 refills | Status: AC | PRN
Start: 2018-07-30 — End: 2018-08-06

## 2018-07-30 NOTE — ED Triage Notes (Signed)
Pt c/o sneezing, coughing, fever for several days.

## 2018-07-30 NOTE — ED Provider Notes (Signed)
[facility]    CSN: [id number] Arrival date & time: 07/30/18  1105     History   Chief Complaint Chief Complaint  Patient presents with  . Fever    HPI Seth Walls is a 23 y.o. male history of asthma, presenting today for evaluation of URI symptoms.  Patient states that he has started to develop sneezing, cough as well as had subjective fevers.  Symptoms began last night.  He is also had associated diarrhea and mild abdominal discomfort.  Denies nausea or vomiting.  He noted a temperature this morning of 100.8.  Did not take any medicines for his fever or other symptoms.  Notes that he recently traveled to [place] and was there for 4 days.  He was on a resort and exposed to people were he is unsure of their travels.  HPI  Past Medical History:  Diagnosis Date  . ADHD (attention deficit hyperactivity disorder)   . Asthma   . Mononucleosis     Patient Active Problem List   Diagnosis Date Noted  . Asthma 10/05/2016  . Adult ADHD 08/10/2008    Past Surgical History:  Procedure Laterality Date  . ELBOW SURGERY     right  . HERNIA REPAIR         Home Medications    Prior to Admission medications   Medication Sig Start Date End Date Taking? Authorizing Provider  albuterol (PROVENTIL HFA;VENTOLIN HFA) 108 (90 Base) MCG/ACT inhaler Inhale 1 puff into the lungs every 4 (four) hours as needed for wheezing. 10/05/16   Carlis Stable, PA-C  amphetamine-dextroamphetamine (ADDERALL) 10 MG tablet Take 1 tablet (10 mg total) by mouth 2 (two) times daily. 03/02/17   Rodolph Bong, MD  benzonatate (TESSALON) 200 MG capsule Take 1 capsule (200 mg total) by mouth 3 (three) times daily as needed for up to 7 days for cough. 07/30/18 08/06/18  Mendel Binsfeld C, PA-C  fluticasone (FLONASE) 50 MCG/ACT nasal spray Place 1-2 sprays into both nostrils daily for 7 days. 07/30/18 08/06/18  Arleigh Odowd C, PA-C  montelukast (SINGULAIR) 10 MG tablet Take 1 tablet (10 mg  total) by mouth at bedtime. 10/05/16   Carlis Stable, PA-C    Family History Family History  Problem Relation Age of Onset  . Depression Mother   . Hyperlipidemia Mother   . Diabetes Mother     Social History Social History   Tobacco Use  . Smoking status: Former Smoker    Years: 2.00    Types: Cigarettes, E-cigarettes    Last attempt to quit: 04/28/2016    Years since quitting: 2.2  . Smokeless tobacco: Current User  . Tobacco comment: <0.25 pack per week  Substance Use Topics  . Alcohol use: Yes    Comment: A few drinks a month  . Drug use: No     Allergies   Patient has no known allergies.   Review of Systems Review of Systems  Constitutional: Positive for fever. Negative for activity change, appetite change, chills and fatigue.  HENT: Positive for congestion and rhinorrhea. Negative for ear pain, sinus pressure, sore throat and trouble swallowing.   Eyes: Negative for discharge and redness.  Respiratory: Positive for cough. Negative for chest tightness and shortness of breath.   Cardiovascular: Negative for chest pain.  Gastrointestinal: Positive for abdominal pain and diarrhea. Negative for nausea and vomiting.  Musculoskeletal: Negative for myalgias.  Skin: Negative for rash.  Neurological: Negative for dizziness, light-headedness and headaches.  Physical Exam Triage Vital Signs ED Triage Vitals [07/30/18 1116]  Enc Vitals Group     BP 137/73     Pulse Rate 74     Resp 16     Temp 98.7 F (37.1 C)     Temp src      SpO2 100 %     Weight      Height      Head Circumference      Peak Flow      Pain Score 0     Pain Loc      Pain Edu?      Excl. in GC?    No data found.  Updated Vital Signs BP 137/73   Pulse 74   Temp 98.7 F (37.1 C)   Resp 16   SpO2 100%   Visual Acuity Right Eye Distance:   Left Eye Distance:   Bilateral Distance:    Right Eye Near:   Left Eye Near:    Bilateral Near:     Physical Exam Vitals  signs and nursing note reviewed.  Constitutional:      Appearance: He is well-developed.  HENT:     Head: Normocephalic and atraumatic.     Ears:     Comments: Bilateral ears without tenderness to palpation of external auricle, tragus and mastoid, EAC's without erythema or swelling, TM's with good bony landmarks and cone of light. Non erythematous.    Mouth/Throat:     Comments: Oral mucosa pink and moist, no tonsillar enlargement or exudate. Posterior pharynx patent and nonerythematous, no uvula deviation or swelling. Normal phonation. Eyes:     Conjunctiva/sclera: Conjunctivae normal.  Neck:     Musculoskeletal: Neck supple.  Cardiovascular:     Rate and Rhythm: Normal rate and regular rhythm.     Heart sounds: No murmur.  Pulmonary:     Effort: Pulmonary effort is normal. No respiratory distress.     Breath sounds: Normal breath sounds.     Comments: Breathing comfortably at rest, CTABL, no wheezing, rales or other adventitious sounds auscultated Abdominal:     Palpations: Abdomen is soft.     Tenderness: There is abdominal tenderness.     Comments: Soft, nondistended, tender to deep palpation of bilateral lower quadrants, negative rebound, negative Rovsing, negative McBurney's  Skin:    General: Skin is warm and dry.  Neurological:     Mental Status: He is alert.      UC Treatments / Results  Labs (all labs ordered are listed, but only abnormal results are displayed) Labs Reviewed - No data to display  EKG None  Radiology No results found.  Procedures Procedures (including critical care time)  Medications Ordered in UC Medications - No data to display  Initial Impression / Assessment and Plan / UC Course  I have reviewed the triage vital signs and the nursing notes.  Pertinent labs & imaging results that were available during my care of the patient were reviewed by me and considered in my medical decision making (see chart for details).     Vital signs  stable in clinic today without fever, and no recent Tylenol or ibuprofen.  URI symptoms x1 day, exam normal, lungs clear.  Most likely viral etiology.  Will recommend symptomatic and supportive care.  Do not seem to be testing criteria for coronavirus.  Continue to monitor temperature, breathing and symptoms,Discussed strict return precautions. Patient verbalized understanding and is agreeable with plan.  Final Clinical Impressions(s) / UC Diagnoses  Final diagnoses:  Viral URI with cough     Discharge Instructions     You likely having a viral upper respiratory infection. We recommended symptom control. I expect your symptoms to start improving in the next 1-2 weeks.   1. Take a daily allergy pill/anti-histamine like Zyrtec, Claritin, or Store brand consistently for 2 weeks  2. For congestion you may try an oral decongestant like Mucinex or sudafed. You may also try intranasal flonase nasal spray or saline irrigations (neti pot, sinus cleanse)  3. For your sore throat you may try cepacol lozenges, salt water gargles, throat spray. Treatment of congestion may also help your sore throat.  4. For cough you may try Robitussen, Mucinex DM  5. Take Tylenol or Ibuprofen to help with pain/inflammation  6. Stay hydrated, drink plenty of fluids to keep throat coated and less irritated  Honey Tea For cough/sore throat try using a honey-based tea. Use 3 teaspoons of honey with juice squeezed from half lemon. Place shaved pieces of ginger into 1/2-1 cup of water and warm over stove top. Then mix the ingredients and repeat every 4 hours as needed.   ED Prescriptions    Medication Sig Dispense Auth. Provider   fluticasone (FLONASE) 50 MCG/ACT nasal spray Place 1-2 sprays into both nostrils daily for 7 days. 1 g Addelyn Alleman C, PA-C   benzonatate (TESSALON) 200 MG capsule Take 1 capsule (200 mg total) by mouth 3 (three) times daily as needed for up to 7 days for cough. 28 capsule Drusilla Wampole  C, PA-C     Controlled Substance Prescriptions [place] Controlled Substance Registry consulted? Not Applicable   Lew Dawes, [place] 07/30/18 1149

## 2018-07-30 NOTE — Discharge Instructions (Signed)
You likely having a viral upper respiratory infection. We recommended symptom control. I expect your symptoms to start improving in the next 1-2 weeks.  ° °1. Take a daily allergy pill/anti-histamine like Zyrtec, Claritin, or Store brand consistently for 2 weeks ° °2. For congestion you may try an oral decongestant like Mucinex or sudafed. You may also try intranasal flonase nasal spray or saline irrigations (neti pot, sinus cleanse) ° °3. For your sore throat you may try cepacol lozenges, salt water gargles, throat spray. Treatment of congestion may also help your sore throat. ° °4. For cough you may try Robitussen, Mucinex DM ° °5. Take Tylenol or Ibuprofen to help with pain/inflammation ° °6. Stay hydrated, drink plenty of fluids to keep throat coated and less irritated ° °Honey Tea °For cough/sore throat try using a honey-based tea. Use 3 teaspoons of honey with juice squeezed from half lemon. Place shaved pieces of ginger into 1/2-1 cup of water and warm over stove top. Then mix the ingredients and repeat every 4 hours as needed. °

## 2018-08-06 ENCOUNTER — Telehealth: Payer: BLUE CROSS/BLUE SHIELD | Admitting: Family

## 2018-08-06 DIAGNOSIS — J4521 Mild intermittent asthma with (acute) exacerbation: Secondary | ICD-10-CM

## 2018-08-06 DIAGNOSIS — R059 Cough, unspecified: Secondary | ICD-10-CM

## 2018-08-06 DIAGNOSIS — R05 Cough: Secondary | ICD-10-CM

## 2018-08-06 DIAGNOSIS — R6889 Other general symptoms and signs: Secondary | ICD-10-CM

## 2018-08-06 MED ORDER — ALBUTEROL SULFATE HFA 108 (90 BASE) MCG/ACT IN AERS: 1.0000 | INHALATION_SPRAY | RESPIRATORY_TRACT | 1 refills | Status: AC | PRN

## 2018-08-06 NOTE — Addendum Note (Signed)
Addended by: Jannifer Rodney A on: 08/06/2018 03:53 PM   Modules accepted: Orders

## 2018-08-06 NOTE — Progress Notes (Signed)
E-Visit for [facility] Virus Screening Based on your current symptoms, it seems like  that your symptoms could related to the Corona virus. '  Very important that you self isolate yourself for the next 14 days and avoid all contact with anyone over the age of 16 years or older!!!   Lots of rest, force fluids, good hand hygiene and tylenol or motrin as needed.   Approximately 5 minutes was spent documenting and reviewing patient's chart.    Coronavirus disease 2019 (COVID-19) is a respiratory illness that can spread from person to person. The virus that causes COVID-19 is a new virus that was first identified in the country of [place] but is now found in multiple other countries and has spread to the [place].  Symptoms associated with the virus are mild to severe fever, cough, and shortness of breath. There is currently no vaccine to protect against COVID-19, and there is no specific antiviral treatment for the virus.  It is vitally important that if you feel that you have an infection such as this virus or any other virus that you stay home and away from places where you may spread it to others.  Currently, not all patients are being tested. If the symptoms are mild and there is not a known exposure, performing the test is not indicated.    Reduce your risk of any infection by using the same precautions used for avoiding the common cold or flu:  Marland Kitchen Wash your hands often with soap and warm water for at least 20 seconds.  If soap and water are not readily available, use an alcohol-based hand sanitizer with at least 60% alcohol.  . If coughing or sneezing, cover your mouth and nose by coughing or sneezing into the elbow areas of your shirt or coat, into a tissue or into your sleeve (not your hands). . Avoid shaking hands with others and consider head nods or verbal greetings only. . Avoid touching your eyes, nose, or mouth with unwashed hands.  . Avoid close contact with people who are  sick. . Avoid places or events with large numbers of people in one location, like concerts or sporting events. . Carefully consider travel plans you have or are making. . If you are planning any travel outside or inside the [place], visit the CDC's Travelers' Health webpage for the latest health notices. . If you have some symptoms but not all symptoms, continue to monitor at home and seek medical attention if your symptoms worsen. . If you are having a medical emergency, call 911.  HOME CARE . Only take medications as instructed by your medical team. . Drink plenty of fluids and get plenty of rest. . A steam or ultrasonic humidifier can help if you have congestion.   GET HELP RIGHT AWAY IF: . You develop worsening fever. . You become short of breath . You cough up blood. . Your symptoms become more severe MAKE SURE YOU   Understand these instructions.  Will watch your condition.  Will get help right away if you are not doing well or get worse.  Your e-visit answers were reviewed by a board certified advanced clinical practitioner to complete your personal care plan.  Depending on the condition, your plan could have included both over the counter or prescription medications.  If there is a problem please reply once you have received a response from your provider. Your safety is important to [place].  If you have drug allergies check your prescription carefully.  You can use MyChart to ask questions about today's visit, request a non-urgent call back, or ask for a work or school excuse for 24 hours related to this e-Visit. If it has been greater than 24 hours you will need to follow up with your provider, or enter a new e-Visit to address those concerns. You will get an e-mail in the next two days asking about your experience.  I hope that your e-visit has been valuable and will speed your recovery. Thank you for using e-visits.

## 2019-01-07 DIAGNOSIS — R05 Cough: Secondary | ICD-10-CM | POA: Diagnosis not present

## 2019-01-07 DIAGNOSIS — Z20828 Contact with and (suspected) exposure to other viral communicable diseases: Secondary | ICD-10-CM | POA: Diagnosis not present

## 2019-01-07 DIAGNOSIS — J029 Acute pharyngitis, unspecified: Secondary | ICD-10-CM | POA: Diagnosis not present

## 2019-02-19 DIAGNOSIS — Z23 Encounter for immunization: Secondary | ICD-10-CM | POA: Diagnosis not present

## 2019-02-19 DIAGNOSIS — J301 Allergic rhinitis due to pollen: Secondary | ICD-10-CM | POA: Diagnosis not present

## 2019-02-19 DIAGNOSIS — J45990 Exercise induced bronchospasm: Secondary | ICD-10-CM | POA: Diagnosis not present

## 2019-02-19 DIAGNOSIS — F988 Other specified behavioral and emotional disorders with onset usually occurring in childhood and adolescence: Secondary | ICD-10-CM | POA: Diagnosis not present

## 2019-02-19 DIAGNOSIS — M25521 Pain in right elbow: Secondary | ICD-10-CM | POA: Diagnosis not present

## 2019-07-08 DIAGNOSIS — F988 Other specified behavioral and emotional disorders with onset usually occurring in childhood and adolescence: Secondary | ICD-10-CM | POA: Diagnosis not present

## 2019-08-07 DIAGNOSIS — R42 Dizziness and giddiness: Secondary | ICD-10-CM | POA: Diagnosis not present

## 2019-10-08 DIAGNOSIS — J301 Allergic rhinitis due to pollen: Secondary | ICD-10-CM | POA: Diagnosis not present

## 2019-10-08 DIAGNOSIS — U071 COVID-19: Secondary | ICD-10-CM | POA: Diagnosis not present

## 2019-10-08 DIAGNOSIS — F988 Other specified behavioral and emotional disorders with onset usually occurring in childhood and adolescence: Secondary | ICD-10-CM | POA: Diagnosis not present

## 2019-10-08 DIAGNOSIS — J45990 Exercise induced bronchospasm: Secondary | ICD-10-CM | POA: Diagnosis not present

## 2019-11-01 DIAGNOSIS — Z20822 Contact with and (suspected) exposure to covid-19: Secondary | ICD-10-CM | POA: Diagnosis not present

## 2019-11-01 DIAGNOSIS — Z03818 Encounter for observation for suspected exposure to other biological agents ruled out: Secondary | ICD-10-CM | POA: Diagnosis not present

## 2019-11-05 DIAGNOSIS — F988 Other specified behavioral and emotional disorders with onset usually occurring in childhood and adolescence: Secondary | ICD-10-CM | POA: Diagnosis not present

## 2019-11-05 DIAGNOSIS — J301 Allergic rhinitis due to pollen: Secondary | ICD-10-CM | POA: Diagnosis not present

## 2019-11-05 DIAGNOSIS — J45901 Unspecified asthma with (acute) exacerbation: Secondary | ICD-10-CM | POA: Diagnosis not present

## 2020-01-13 DIAGNOSIS — F988 Other specified behavioral and emotional disorders with onset usually occurring in childhood and adolescence: Secondary | ICD-10-CM | POA: Diagnosis not present

## 2020-01-13 DIAGNOSIS — J45901 Unspecified asthma with (acute) exacerbation: Secondary | ICD-10-CM | POA: Diagnosis not present

## 2020-01-13 DIAGNOSIS — J301 Allergic rhinitis due to pollen: Secondary | ICD-10-CM | POA: Diagnosis not present

## 2020-03-28 DIAGNOSIS — J45901 Unspecified asthma with (acute) exacerbation: Secondary | ICD-10-CM | POA: Diagnosis not present

## 2020-03-28 DIAGNOSIS — Z20822 Contact with and (suspected) exposure to covid-19: Secondary | ICD-10-CM | POA: Diagnosis not present

## 2020-04-20 DIAGNOSIS — J45990 Exercise induced bronchospasm: Secondary | ICD-10-CM | POA: Diagnosis not present

## 2020-04-20 DIAGNOSIS — F988 Other specified behavioral and emotional disorders with onset usually occurring in childhood and adolescence: Secondary | ICD-10-CM | POA: Diagnosis not present

## 2020-04-20 DIAGNOSIS — J301 Allergic rhinitis due to pollen: Secondary | ICD-10-CM | POA: Diagnosis not present

## 2020-11-02 ENCOUNTER — Emergency Department (INDEPENDENT_AMBULATORY_CARE_PROVIDER_SITE_OTHER)
Admission: EM | Admit: 2020-11-02 | Discharge: 2020-11-02 | Disposition: A | Discharge status: Home / Self Care | Payer: BC Managed Care – PPO | Source: Home / Self Care

## 2020-11-02 ENCOUNTER — Other Ambulatory Visit: Payer: Self-pay

## 2020-11-02 DIAGNOSIS — R109 Unspecified abdominal pain: Secondary | ICD-10-CM | POA: Diagnosis not present

## 2020-11-02 DIAGNOSIS — R310 Gross hematuria: Secondary | ICD-10-CM

## 2020-11-02 LAB — POCT URINALYSIS DIP (MANUAL ENTRY)
Glucose, UA: NEGATIVE mg/dL
Ketones, POC UA: NEGATIVE mg/dL
Nitrite, UA: NEGATIVE
Protein Ur, POC: 30 mg/dL — AB
Spec Grav, UA: 1.02 (ref 1.010–1.025)
Urobilinogen, UA: 0.2 E.U./dL
pH, UA: 6 (ref 5.0–8.0)

## 2020-11-02 MED ORDER — TAMSULOSIN HCL 0.4 MG PO CAPS: 0.4000 mg | ORAL_CAPSULE | Freq: Every day | ORAL | 0 refills | Status: AC

## 2020-11-02 NOTE — ED Provider Notes (Signed)
[facility]    CSN: [id number] Arrival date & time: 11/02/20  1107      History   Chief Complaint Chief Complaint  Patient presents with   Flank Pain    R side, not flank   Hematuria    HPI Seth Walls is a 25 y.o. male.   Patient presents with concerns of pink/red and dark urine since this morning. He states it looked dark last night but since he woke up everytime he goes it is very dark and red/pink in color. He reports some mild discomfort to his right upper side since this morning but it comes and goes and denies any current pain. He denies any urinary frequency, urgency, pain, or abdominal pain, nausea/vomiting, or fever. He denies prior similar. He has not tried anything for it. The patient reports his father has kidney stones.   The history is provided by the patient.  Flank Pain Pertinent negatives include no abdominal pain and no headaches.  Hematuria Pertinent negatives include no abdominal pain and no headaches.   Past Medical History:  Diagnosis Date   ADHD (attention deficit hyperactivity disorder)    Asthma    Mononucleosis     Patient Active Problem List   Diagnosis Date Noted   Asthma 10/05/2016   Adult ADHD 08/10/2008    Past Surgical History:  Procedure Laterality Date   ELBOW SURGERY     right   HERNIA REPAIR         Home Medications    Prior to Admission medications   Medication Sig Start Date End Date Taking? Authorizing Provider  tamsulosin (FLOMAX) 0.4 MG CAPS capsule Take 1 capsule (0.4 mg total) by mouth daily. 11/02/20  Yes Aamori Mcmasters L, PA  albuterol (PROVENTIL HFA;VENTOLIN HFA) 108 (90 Base) MCG/ACT inhaler Inhale 1 puff into the lungs every 4 (four) hours as needed for wheezing. 08/06/18   Jannifer Rodney A, FNP  amphetamine-dextroamphetamine (ADDERALL) 10 MG tablet Take 1 tablet (10 mg total) by mouth 2 (two) times daily. 03/02/17   Rodolph Bong, MD  fluticasone (FLONASE) 50 MCG/ACT nasal spray Place 1-2 sprays into  both nostrils daily for 7 days. 07/30/18 08/06/18  Wieters, Hallie C, PA-C  montelukast (SINGULAIR) 10 MG tablet Take 1 tablet (10 mg total) by mouth at bedtime. 10/05/16   Carlis Stable, PA-C    Family History Family History  Problem Relation Age of Onset   Depression Mother    Hyperlipidemia Mother    Diabetes Mother    Healthy Father     Social History Social History   Tobacco Use   Smoking status: Former    Years: 2.00    Pack years: 0.00    Types: Cigarettes, E-cigarettes    Quit date: 04/28/2016    Years since quitting: 4.5   Smokeless tobacco: Current   Tobacco comments:    <0.25 pack per week  Vaping Use   Vaping Use: Former  Substance Use Topics   Alcohol use: Yes    Comment: A few drinks a month   Drug use: No     Allergies   Patient has no known allergies.   Review of Systems Review of Systems  Constitutional:  Negative for fatigue and fever.  Gastrointestinal:  Negative for abdominal pain, nausea and vomiting.  Genitourinary:  Positive for flank pain and hematuria. Negative for difficulty urinating, dysuria, frequency, genital sores, penile pain, scrotal swelling, testicular pain and urgency.  Musculoskeletal:  Negative for back pain.  Skin:  Negative for rash.  Neurological:  Negative for headaches.    Physical Exam Triage Vital Signs ED Triage Vitals  Enc Vitals Group     BP 11/02/20 1127 (!) 137/91     Pulse Rate 11/02/20 1127 78     Resp 11/02/20 1127 18     Temp 11/02/20 1127 98.3 F (36.8 C)     Temp Source 11/02/20 1127 Oral     SpO2 11/02/20 1127 100 %     Weight 11/02/20 1122 145 lb (65.8 kg)     Height 11/02/20 1122 5\' 8"  (1.727 m)     Head Circumference --      Peak Flow --      Pain Score 11/02/20 1121 3     Pain Loc --      Pain Edu? --      Excl. in GC? --    No data found.  Updated Vital Signs BP (!) 137/91   Pulse 78   Temp 98.3 F (36.8 C) (Oral)   Resp 18   Ht 5\' 8"  (1.727 m)   Wt 145 lb (65.8 kg)    SpO2 100%   BMI 22.05 kg/m   Visual Acuity Right Eye Distance:   Left Eye Distance:   Bilateral Distance:    Right Eye Near:   Left Eye Near:    Bilateral Near:     Physical Exam Vitals and nursing note reviewed.  Constitutional:      General: He is not in acute distress. HENT:     Head: Normocephalic.  Eyes:     Pupils: Pupils are equal, round, and reactive to light.  Cardiovascular:     Rate and Rhythm: Normal rate and regular rhythm.     Heart sounds: Normal heart sounds.  Pulmonary:     Effort: Pulmonary effort is normal.     Breath sounds: Normal breath sounds.  Abdominal:     Palpations: Abdomen is soft.     Tenderness: There is no abdominal tenderness. There is no right CVA tenderness, left CVA tenderness or guarding.     Comments: Pt points to right lateral upper side just under rib margin as area of intermittent pain - slightly tender.   Genitourinary:    Comments: Pt declined Skin:    Findings: No rash.  Neurological:     Mental Status: He is alert.  Psychiatric:        Mood and Affect: Mood normal.     UC Treatments / Results  Labs (all labs ordered are listed, but only abnormal results are displayed) Labs Reviewed  POCT URINALYSIS DIP (MANUAL ENTRY) - Abnormal; Notable for the following components:      Result Value   Color, UA brown (*)    Clarity, UA cloudy (*)    Bilirubin, UA large (*)    Blood, UA large (*)    Protein Ur, POC =30 (*)    Leukocytes, UA Small (1+) (*)    All other components within normal limits  URINE CULTURE  URINALYSIS, ROUTINE W REFLEX MICROSCOPIC    EKG   Radiology No results found.  Procedures Procedures (including critical care time)  Medications Ordered in UC Medications - No data to display  Initial Impression / Assessment and Plan / UC Course  I have reviewed the triage vital signs and the nursing notes.  Pertinent labs & imaging results that were available during my care of the patient were reviewed  by me and considered in  my medical decision making (see chart for details).     Concern for nephrolithiasis given hematuria and intermittent flank pain. Discussed with patient ordering CT to determine presence of stone and he declined at this time. Will order sx tx with Flomax and send out Ucx and microscopy for further eval of hematuria. Discussed fu w PCP or urology if sx persist and ER precautions.   E/M: 1 acute complicated illness, 3 data (UA, Ucx, microscopy), moderate risk due to prescription management  Final Clinical Impressions(s) / UC Diagnoses   Final diagnoses:  Gross hematuria  Right flank pain     Discharge Instructions      Suspect possible kidney stone as cause of blood in your urine as well as side pain. You declined imaging today. Recommend taking Flomax as prescribed and pushing fluids to help pass any possible stone. It is best to strain your urine and keep any stone you pass. If no improvement follow-up with PCP or urology. Go to the ER if worsening pain or develop new concerning symptoms.     ED Prescriptions     Medication Sig Dispense Auth. Provider   tamsulosin (FLOMAX) 0.4 MG CAPS capsule Take 1 capsule (0.4 mg total) by mouth daily. 30 capsule Vallery Sa, Kimila Papaleo L, PA      PDMP not reviewed this encounter.   Estanislado Pandy, [place] 11/02/20 1222

## 2020-11-02 NOTE — ED Triage Notes (Signed)
Pt presents to Urgent Care with c/o "pinkish cloudy urine" this AM. He denies dysuria, but states he does have some intermittent pain to R upper side.

## 2020-11-02 NOTE — Discharge Instructions (Addendum)
Suspect possible kidney stone as cause of blood in your urine as well as side pain. You declined imaging today. Recommend taking Flomax as prescribed and pushing fluids to help pass any possible stone. It is best to strain your urine and keep any stone you pass. If no improvement follow-up with PCP or urology. Go to the ER if worsening pain or develop new concerning symptoms.

## 2020-11-03 ENCOUNTER — Telehealth: Payer: Self-pay | Admitting: Emergency Medicine

## 2020-11-03 LAB — URINALYSIS, ROUTINE W REFLEX MICROSCOPIC
Bilirubin Urine: NEGATIVE
Glucose, UA: NEGATIVE
Hyaline Cast: NONE SEEN /LPF
Ketones, ur: NEGATIVE
Nitrite: POSITIVE — AB
Specific Gravity, Urine: 1.039 — ABNORMAL HIGH (ref 1.001–1.035)
Squamous Epithelial / LPF: NONE SEEN /HPF (ref <=5)
pH: 6 (ref 5.0–8.0)

## 2020-11-03 LAB — URINE CULTURE
MICRO NUMBER:: 12010778
Result:: NO GROWTH
SPECIMEN QUALITY:: ADEQUATE

## 2020-11-03 LAB — MICROSCOPIC MESSAGE

## 2020-11-03 LAB — EXTRA URINE SPECIMEN

## 2020-11-03 NOTE — Telephone Encounter (Deleted)
Lab results for urine culture sent to Dr Cathren Harsh for review

## 2020-11-03 NOTE — Telephone Encounter (Signed)
Urine results sent to Dr Cathren Harsh - urine culture pending - unable to contact pt to see if he is feeling better

## 2020-11-03 NOTE — Telephone Encounter (Signed)
Call to see how pt was doing, no answer - will call back - awaiting provider review of urine culture - Dr Cathren Harsh here today

## 2021-07-25 ENCOUNTER — Emergency Department
Admission: EM | Admit: 2021-07-25 | Discharge: 2021-07-25 | Disposition: A | Discharge status: Home / Self Care | Payer: BC Managed Care – PPO | Source: Home / Self Care

## 2021-07-25 ENCOUNTER — Other Ambulatory Visit: Payer: Self-pay

## 2021-07-25 ENCOUNTER — Encounter: Payer: Self-pay | Admitting: Emergency Medicine

## 2021-07-25 DIAGNOSIS — L739 Follicular disorder, unspecified: Secondary | ICD-10-CM

## 2021-07-25 MED ORDER — SULFAMETHOXAZOLE-TRIMETHOPRIM 800-160 MG PO TABS: 1.0000 | ORAL_TABLET | Freq: Two times a day (BID) | ORAL | 0 refills | Status: AC

## 2021-07-25 NOTE — ED Provider Notes (Signed)
?[facility] ? ? ? ?CSN: [id number] ?Arrival date & time: 07/25/21  1012 ? ? ?  ? ?History   ?Chief Complaint ?Chief Complaint  ?Patient presents with  ? Cellulitis  ? ? ?HPI ?Seth Walls is a 26 y.o. male.  ? ?HPI 26 year old male presents with concern of cellulitis of left upper chest.  Patient reports area of has increased in redness over the past 2 days.  Denies fever. ? ?Past Medical History:  ?Diagnosis Date  ? ADHD (attention deficit hyperactivity disorder)   ? Asthma   ? Mononucleosis   ? ? ?Patient Active Problem List  ? Diagnosis Date Noted  ? Asthma 10/05/2016  ? Adult ADHD 08/10/2008  ? ? ?Past Surgical History:  ?Procedure Laterality Date  ? ELBOW SURGERY    ? right  ? HERNIA REPAIR    ? LITHOTRIPSY    ? ? ? ? ? ?Home Medications   ? ?Prior to Admission medications   ?Medication Sig Start Date End Date Taking? Authorizing Provider  ?sulfamethoxazole-trimethoprim (BACTRIM DS) 800-160 MG tablet Take 1 tablet by mouth 2 (two) times daily for 10 days. 07/25/21 08/04/21 Yes Eliezer Lofts, FNP  ?albuterol (PROVENTIL HFA;VENTOLIN HFA) 108 (90 Base) MCG/ACT inhaler Inhale 1 puff into the lungs every 4 (four) hours as needed for wheezing. ?Patient not taking: Reported on 07/25/2021 08/06/18   Sharion Balloon, FNP  ?amphetamine-dextroamphetamine (ADDERALL) 10 MG tablet Take 1 tablet (10 mg total) by mouth 2 (two) times daily. ?Patient not taking: Reported on 07/25/2021 03/02/17   Gregor Hams, MD  ?fluticasone [facility]) 50 MCG/ACT nasal spray Place 1-2 sprays into both nostrils daily for 7 days. 07/30/18 08/06/18  Wieters, Hallie C, PA-C  ?montelukast (SINGULAIR) 10 MG tablet Take 1 tablet (10 mg total) by mouth at bedtime. ?Patient not taking: Reported on 07/25/2021 10/05/16   Trixie Dredge, PA-C  ?tamsulosin (FLOMAX) 0.4 MG CAPS capsule Take 1 capsule (0.4 mg total) by mouth daily. ?Patient not taking: Reported on 07/25/2021 11/02/20   Delsa Sale, PA  ? ? ?Family History ?Family History  ?Problem  Relation Age of Onset  ? Depression Mother   ? Hyperlipidemia Mother   ? Diabetes Mother   ? Healthy Father   ? ? ?Social History ?Social History  ? ?Tobacco Use  ? Smoking status: Former  ?  Years: 2.00  ?  Types: Cigarettes, E-cigarettes  ?  Quit date: 04/28/2016  ?  Years since quitting: 5.2  ? Smokeless tobacco: Current  ? Tobacco comments:  ?  <0.25 pack per week  ?Vaping Use  ? Vaping Use: Former  ?Substance Use Topics  ? Alcohol use: Not Currently  ? Drug use: No  ? ? ? ?Allergies   ?Patient has no known allergies. ? ? ?Review of Systems ?Review of Systems  ?Skin:  Positive for rash.  ?All other systems reviewed and are negative. ? ? ?Physical Exam ?Triage Vital Signs ?ED Triage Vitals  ?Enc Vitals Group  ?   BP 07/25/21 1044 119/80  ?   Pulse Rate 07/25/21 1044 63  ?   Resp 07/25/21 1044 15  ?   Temp 07/25/21 1044 98.3 ?F (36.8 ?C)  ?   Temp Source 07/25/21 1044 Oral  ?   SpO2 07/25/21 1044 98 %  ?   Weight 07/25/21 1047 165 lb (74.8 kg)  ?   Height 07/25/21 1047 5\' 8"  (1.727 m)  ?   Head Circumference --   ?  Peak Flow --   ?   Pain Score 07/25/21 1047 4  ?   Pain Loc --   ?   Pain Edu? --   ?   Excl. in GC? --   ? ?No data found. ? ?Updated Vital Signs ?BP 119/80 (BP Location: Left Arm)   Pulse 63   Temp 98.3 ?F (36.8 ?C) (Oral)   Resp 15   Ht 5\' 8"  (1.727 m)   Wt 165 lb (74.8 kg)   SpO2 98%   BMI 25.09 kg/m?  ? ? ?Physical Exam ?Vitals and nursing note reviewed.  ?Constitutional:   ?   General: He is not in acute distress. ?   Appearance: Normal appearance. He is normal weight. He is not ill-appearing.  ?HENT:  ?   Head: Normocephalic and atraumatic.  ?   Mouth/Throat:  ?   Mouth: Mucous membranes are moist.  ?   Pharynx: Oropharynx is clear.  ?Eyes:  ?   Extraocular Movements: Extraocular movements intact.  ?   Conjunctiva/sclera: Conjunctivae normal.  ?   Pupils: Pupils are equal, round, and reactive to light.  ?Cardiovascular:  ?   Rate and Rhythm: Normal rate and regular rhythm.  ?   Pulses:  Normal pulses.  ?   Heart sounds: Normal heart sounds.  ?Pulmonary:  ?   Effort: Pulmonary effort is normal.  ?   Breath sounds: Normal breath sounds.  ?Musculoskeletal:  ?   Cervical back: Normal range of motion and neck supple.  ?Skin: ?   General: Skin is warm and dry.  ?   Comments: Left upper chest/superior pectoral area:~1.0 cm circular shaped erythematous papule with 2 mm white pustular center, no discharge/drainage/lymphatic streaking  ?Neurological:  ?   General: No focal deficit present.  ?   Mental Status: He is alert and oriented to person, place, and time.  ? ? ? ?UC Treatments / Results  ?Labs ?(all labs ordered are listed, but only abnormal results are displayed) ?Labs Reviewed - No data to display ? ?EKG ? ? ?Radiology ?No results found. ? ?Procedures ?Procedures (including critical care time) ? ?Medications Ordered in UC ?Medications - No data to display ? ?Initial Impression / Assessment and Plan / UC Course  ?I have reviewed the triage vital signs and the nursing notes. ? ?Pertinent labs & imaging results that were available during my care of the patient were reviewed by me and considered in my medical decision making (see chart for details). ? ?  ? ?MDM: 1.  Folliculitis-Rx'd Bactrim.  Advised patient to allow pustular head to become full over the next 1 to 2 days and may express central fluid while bathing.  Advised patient to leave uncovered and to use no topical ointments/creams. Advised patient to take medication as directed with food to completion.  Encouraged patient to increase daily water intake while taking this medication.  Patient discharged home, hemodynamically stable. ?Final Clinical Impressions(s) / UC Diagnoses  ? ?Final diagnoses:  ?Folliculitis  ? ? ? ?Discharge Instructions   ? ?  ?Advised patient to take medication as directed with food to completion.  Encouraged patient to increase daily water intake while taking this medication.  Advised if symptoms worsen and/or unresolved  please follow-up with PCP or here for further evaluation. ? ? ? ? ?ED Prescriptions   ? ? Medication Sig Dispense Auth. Provider  ? sulfamethoxazole-trimethoprim (BACTRIM DS) 800-160 MG tablet Take 1 tablet by mouth 2 (two) times daily for 10 days. 20  tablet Eliezer Lofts, FNP  ? ?  ? ?PDMP not reviewed this encounter. ?  ?Eliezer Lofts, [place] ?07/25/21 1132 ? ?

## 2021-07-25 NOTE — Discharge Instructions (Addendum)
Advised patient to take medication as directed with food to completion.  Encouraged patient to increase daily water intake while taking this medication.  Advised if symptoms worsen and/or unresolved please follow-up with PCP or here for further evaluation. 

## 2021-07-25 NOTE — ED Triage Notes (Signed)
Red area noted to left upper chest , above tattoo ?Pt noted a pimple on Sunday  ?Redness has increased since then  ?Pt noted a yellow center today  ?Denies fever  ? ?
